# Patient Record
Sex: Female | Born: 1980 | Hispanic: Yes | Marital: Single | Smoking: Never smoker
Health system: Southern US, Community
[De-identification: ages and names within clinical notes are randomized; demographics above are authoritative.]

## PROBLEM LIST (undated history)

## (undated) DIAGNOSIS — E669 Obesity, unspecified: Secondary | ICD-10-CM

## (undated) DIAGNOSIS — E66811 Obesity, class 1: Secondary | ICD-10-CM

## (undated) HISTORY — DX: Obesity, unspecified: E66.9

## (undated) HISTORY — DX: Obesity, class 1: E66.811

## (undated) HISTORY — PX: CHOLECYSTECTOMY: SHX55

---

## 1999-08-05 ENCOUNTER — Ambulatory Visit (HOSPITAL_COMMUNITY): Admission: RE | Admit: 1999-08-05 | Discharge: 1999-08-05 | Payer: Self-pay | Admitting: Obstetrics and Gynecology

## 1999-11-08 ENCOUNTER — Inpatient Hospital Stay (HOSPITAL_COMMUNITY): Admission: AD | Admit: 1999-11-08 | Discharge: 1999-11-08 | Payer: Self-pay | Admitting: Obstetrics

## 1999-11-09 ENCOUNTER — Inpatient Hospital Stay (HOSPITAL_COMMUNITY): Admission: AD | Admit: 1999-11-09 | Discharge: 1999-11-11 | Payer: Self-pay | Admitting: Obstetrics & Gynecology

## 1999-11-09 ENCOUNTER — Encounter (INDEPENDENT_AMBULATORY_CARE_PROVIDER_SITE_OTHER): Payer: Self-pay | Admitting: Specialist

## 1999-12-23 ENCOUNTER — Encounter: Payer: Self-pay | Admitting: Emergency Medicine

## 1999-12-23 ENCOUNTER — Emergency Department (HOSPITAL_COMMUNITY): Admission: EM | Admit: 1999-12-23 | Discharge: 1999-12-23 | Payer: Self-pay | Admitting: Emergency Medicine

## 2000-01-21 ENCOUNTER — Ambulatory Visit (HOSPITAL_COMMUNITY): Admission: RE | Admit: 2000-01-21 | Discharge: 2000-01-22 | Payer: Self-pay | Admitting: General Surgery

## 2000-01-21 ENCOUNTER — Encounter (INDEPENDENT_AMBULATORY_CARE_PROVIDER_SITE_OTHER): Payer: Self-pay | Admitting: Specialist

## 2002-04-26 ENCOUNTER — Ambulatory Visit (HOSPITAL_COMMUNITY): Admission: RE | Admit: 2002-04-26 | Discharge: 2002-04-26 | Payer: Self-pay | Admitting: Obstetrics and Gynecology

## 2002-08-01 ENCOUNTER — Encounter: Payer: Self-pay | Admitting: *Deleted

## 2002-08-01 ENCOUNTER — Inpatient Hospital Stay (HOSPITAL_COMMUNITY): Admission: AD | Admit: 2002-08-01 | Discharge: 2002-08-01 | Payer: Self-pay | Admitting: *Deleted

## 2002-08-02 ENCOUNTER — Encounter (INDEPENDENT_AMBULATORY_CARE_PROVIDER_SITE_OTHER): Payer: Self-pay | Admitting: *Deleted

## 2002-08-02 ENCOUNTER — Inpatient Hospital Stay (HOSPITAL_COMMUNITY): Admission: AD | Admit: 2002-08-02 | Discharge: 2002-08-05 | Payer: Self-pay | Admitting: *Deleted

## 2002-08-02 ENCOUNTER — Inpatient Hospital Stay (HOSPITAL_COMMUNITY): Admission: AD | Admit: 2002-08-02 | Discharge: 2002-08-02 | Payer: Self-pay | Admitting: *Deleted

## 2002-08-02 DIAGNOSIS — O321XX0 Maternal care for breech presentation, not applicable or unspecified: Secondary | ICD-10-CM

## 2006-06-28 ENCOUNTER — Inpatient Hospital Stay (HOSPITAL_COMMUNITY): Admission: AD | Admit: 2006-06-28 | Discharge: 2006-06-28 | Payer: Self-pay | Admitting: Obstetrics and Gynecology

## 2006-09-17 ENCOUNTER — Inpatient Hospital Stay (HOSPITAL_COMMUNITY): Admission: AD | Admit: 2006-09-17 | Discharge: 2006-09-21 | Payer: Self-pay | Admitting: Obstetrics

## 2006-09-18 DIAGNOSIS — IMO0002 Reserved for concepts with insufficient information to code with codable children: Secondary | ICD-10-CM

## 2010-04-28 ENCOUNTER — Other Ambulatory Visit: Payer: Self-pay | Admitting: Family Medicine

## 2010-04-28 DIAGNOSIS — N6310 Unspecified lump in the right breast, unspecified quadrant: Secondary | ICD-10-CM

## 2010-05-01 ENCOUNTER — Ambulatory Visit
Admission: RE | Admit: 2010-05-01 | Discharge: 2010-05-01 | Disposition: A | Discharge status: Home / Self Care | Payer: Self-pay | Source: Ambulatory Visit | Attending: Family Medicine | Admitting: Family Medicine

## 2010-05-01 DIAGNOSIS — N6310 Unspecified lump in the right breast, unspecified quadrant: Secondary | ICD-10-CM

## 2010-06-24 NOTE — Op Note (Signed)
NAMELAQUITA, HARLAN NO.:  [id number]   MEDICAL RECORD NO.:  [id number]          PATIENT TYPE:  INP   LOCATION:  9164                          FACILITY:  [facility]   PHYSICIAN:  Kathreen Cosier, M.D.DATE OF BIRTH:  1981-01-04   DATE OF PROCEDURE:  09/18/2006  DATE OF DISCHARGE:                               OPERATIVE REPORT   PREOPERATIVE DIAGNOSIS:  Failure to progress in labor.   SURGEON:  Dr. Gaynell Face   ANESTHESIA:  Epidural.   PROCEDURE:  The patient placed on the operating table in supine  position.  Abdomen prepped and draped. Bladder emptied with Foley  catheter and a transverse suprapubic incision made through the old scar,  carried down to the rectus fascia. Fascia cleaned and incised length of  incision.  Recti muscles retracted laterally.  Peritoneum incised  longitudinally.  Transverse incision made in the visceral peritoneum  above the bladder.  Bladder mobilized inferiorly.  Transverse lower  uterine incision made.  The patient delivered from OP position of a female  Apgar 08/09, weighing 8 pounds 7 ounces from the OP position.  The team  in attendance.  The fluid clear.  Placenta anterior removed manually and  sent to pathology.  Uterine cavity cleaned with dry laps.  Uterine  incision closed in one layer with continuous suture of #1 chromic.  Hemostasis satisfactory.  Bladder flap reattached 2-0 chromic.  Uterus  well contracted.  Tubes and ovaries normal. Abdomen closed in layers,  peritoneum continuous suture of 0 chromic, fascia continuous suture of 0  Dexon, skin closed with subcuticular stitch of 4-0 Monocryl.  Blood loss  600 mL.           ______________________________  Kathreen Cosier, M.D.     BAM/MEDQ  D:  09/18/2006  T:  09/19/2006  Job:  [id number]

## 2010-06-24 NOTE — H&P (Signed)
NAMEVALMA, ROTENBERG NO.:  [id number]   MEDICAL RECORD NO.:  [id number]          PATIENT TYPE:  INP   LOCATION:  9164                          [facility]  [facility]   PHYSICIAN:  Kathreen Cosier, M.D.DATE OF BIRTH:  10/24/1980   DATE OF ADMISSION:  09/17/2006  DATE OF DISCHARGE:                              HISTORY & PHYSICAL   The patient is a 30 year old gravida 3, para 2-0-0-2, had one C-section  for breech.  Her estimated date of confinement September 16, 2006.  She had  a negative group B strep and she wanted VBAC.  The cervix was 1 to 2 cm,  80%, vertex -2.  Membranes ruptured artificially, fluid clear.  IUPC was  inserted and she was started on low dose Pitocin.   The patient received Pitocin throughout the day and was fully dilated at  8:51 p.m.  She pushed for 1-1/2 hours to +1 station, then there was no  further descent.  The vertex was molded and it was decided, after  pushing for 2 hours she would be delivered by C-section for failure to  progress in labor.   PHYSICAL EXAMINATION:  GENERAL:  This is a well-developed female in  labor.  HEENT:  Negative.  LUNGS:  Clear.  HEART:  Regular rhythm, no murmurs, no gallops.  BREASTS:  No masses.  ABDOMEN:  Term with estimated fetal weight greater than 8 pounds.  EXTREMITIES:  Negative.           ______________________________  Kathreen Cosier, M.D.     BAM/MEDQ  D:  09/18/2006  T:  09/18/2006  Job:  [id number]

## 2010-06-27 NOTE — Discharge Summary (Signed)
   Shelby Camacho, Shelby Camacho                          ACCOUNT NO.:  [id number]   MEDICAL RECORD NO.:  [id number]                   PATIENT TYPE:  INP   LOCATION:  9142                                 [facility]  [facility]   PHYSICIAN:  Dr. Kristen Loader                    DATE OF BIRTH:  [birth year]   DATE OF ADMISSION:  08/02/2002  DATE OF DISCHARGE:  08/05/2002                                 DISCHARGE SUMMARY   DISCHARGE DIAGNOSES:  1. Postoperative day #3 status post low transverse Cesarean section at 41     and 2 weeks for non-reassuring fetal heart tones.  2. Anemia.   DISCHARGE MEDICATIONS:  Include Motrin, Percocet, prenatal vitamins, iron  sulfate, and Depo-Provera.   HOSPITAL COURSE:  A 30 year old now G2, P2-0-0-2 postoperative day #3 status  post LTCS at 41-2/[redacted] week gestation for non-reassuring fetal heart tone  secondary to transverse arrest. The patient presented in active labor, given  an epidural. She had spontaneous rupture of membranes with clear fluid. She  experienced fetal tachycardia after the epidural and experienced maternal  hypotension. Was subsequently given 3 doses of Ephedrine. Mom was placed on  her left side, given supplemental oxygen and an IUPC and fetal scalp  electrodes were placed. She was started on Pitocin and received an  amnioinfusion for variable decelerations. After a trial of pushing with an  anterior lip, it was discovered that the baby was experiencing transverse  arrest and taken for emergent Cesarean section. She was found to have an  occult prolapsed umbilical cord. She delivered a term female infant with  Apgars of 8 at 1 minute and 9 at 5 minutes. The placenta was manually  removed. She had a total blood loss of approximately 600 cc. Tolerated the  procedure well and did well postoperatively. She plans to breast feed. She  is receiving Depo-Provera for birth control. She does not desire  circumcision.  In reviewing her labs, she is Rubella immune,  A negative,  antibody negative and GBS negative.   DISCHARGE LABORATORY DATA:  Include a hematocrit of 29.2, hemoglobin 9.9.   FOLLOW UP:  She is to followup at 6 weeks at [facility].     Lorne Skeens, D.O.                         Dr. Kristen Loader    KL/MEDQ  D:  08/25/2002  T:  08/27/2002  Job:  [id number]

## 2010-06-27 NOTE — Op Note (Signed)
[facility]  Patient:    Shelby Camacho, Shelby Camacho                       MRN: [id number] Proc. Date: 01/21/00 Adm. Date:  [id number] Attending:  Chevis Pretty S                           Operative Report  PREOPERATIVE DIAGNOSIS:  Symptomatic cholelithiasis.  POSTOPERATIVE DIAGNOSIS:  Symptomatic cholelithiasis.  OPERATION:  Laparoscopic cholecystectomy.  SURGEON:  Chevis Pretty, M.D.  ASSISTANT:  Angelia Mould. Derrell Lolling, M.D.  ANESTHESIA:  General endotracheal  DESCRIPTION OF PROCEDURE:  After informed consent was obtained, the patient was brought to the operating room and placed in the supine position on the operating room table.  After adequate induction of general anesthesia, the patients abdomen was prepped with Betadine and draped in the usual sterile manner.  A small infraumbilical vertically oriented incision was made with the 15 blade knife.  This incision was carried down through the subcutaneous tissue using blunt dissection with a Maryann Conners until the fascia of the anterior abdominal wall was encountered.  This fascial layer was incised with 15 blade knife and each side was grasped with Kocher clamps and elevated.  The preperitoneal space was then probed with the Kelly clamp bluntly until the peritoneum was opened and access was gained to the abdominal cavity.  Next, a 0 Vicryl pursestring stitch was placed in the fascia around this hole and a Hasson cannula was placed through this into the abdomen.  The Hasson was anchored with the previously placed pursestring stitch.  The abdomen was insufflated with carbon dioxide.  A 0 degree laparoscope was placed through the Hasson cannula and the edge of the liver and gallbladder were readily visible.  The patient was placed in the head up position and rotated slightly with the right side up.  Next, a small transverse upper midline incision was made with the 15 blade knife and a 10  mm trocar was placed through this incision into the abdominal cavity bluntly under direct vision.  Two smaller incisions were placed on the right side of the abdomen laterally with the 15 blade knife and 5 mm ports were placed through these incisions in the abdominal cavity under direct vision.  A blunt grasper was placed through the lateral most 5 mm trocar and used to grasp the dome of the gallbladder and to elevate it anteriorly and superiorly.  Another blunt grasper was placed at the other lateral port and used to grasp the infundibulum and neck of the gallbladder and retract it laterally.  A [place] dissector was placed through the upper midline port and using a combination of Bovie electrocautery and blunt dissection the gallbladder neck cystic duct junction was identified.  This area was dissected bluntly with the [place] dissector until the gallbladder neck cystic duct junction was visualized circumferentially.  Care was taken to keep the common duct medial to this dissection.  Once the cystic duct was identified and completely visualized as well as the area of the cystic duct gallbladder neck junction, three clips were placed proximally on the cystic duct and one distally and the cystic duct was divided between the two with laparoscopic scissors.  The cystic artery was identified posterior to this structure and was also dissected in a circumferential manner.  Two clips were placed proximally and one distally in the artery and the  artery was divided between the two with laparoscopic scissors.  Once this was complete, the gallbladder was then separated from the liver bed using a combination of blunt dissection and sharp dissection with both the hook and spatula electrocautery.  A small hole was made in the wall of the gallbladder in separating it from the liver bed.  When the gallbladder was nearly completely completely separated form the liver bed, the liver bed was then  re-examined and several small bleeding points were coagulated with the Bovie electrocautery and spatula.  The liver bed was then re-examined and was found to be hemostatic.  The gallbladder was then detached from the liver bed using the spatula electrocautery.  The laparoscope was then moved to the upper midline port and an endoscopic bag was placed through the Hasson cannula.  The endoscopic bag was then placed underneath the gallbladder and opened and the gallbladder was placed within the bag.  The endoscopic bag was then closed and removed from the abdomen through the umbilical port under direct vision.  The fascia through the infraumbilical hole was then closed with the previously placed pursestring Vicryl.  The liver bed was then re-examined and was found to be hemostatic.  The abdomen was then irrigated with copious amounts of saline to remove all the bile DD:  01/21/00 TD:  01/21/00 Job: [id number]

## 2010-06-27 NOTE — Discharge Summary (Signed)
NAMESCHYLER, BUTIKOFER NO.:  [id number]   MEDICAL RECORD NO.:  [id number]          PATIENT TYPE:  INP   LOCATION:  9117                          [facility]   PHYSICIAN:  Kathreen Cosier, M.D.DATE OF BIRTH:  03-21-1980   DATE OF ADMISSION:  09/17/2006  DATE OF DISCHARGE:  09/21/2006                               DISCHARGE SUMMARY   The patient is a 30 year old, gravida 3, para 2-0-2-1 previous C-section  for breech.  Her EDC was September 16, 2006 and negative GBS, and she wanted  to deliver vaginally.  She was 1-2 cm, 80% vertex, -2 on admission.  Estimated fetal weight was between 8-9 pounds.  The patient labored and  became fully dilated and pushed for 90 minutes with a vertex molded to  +1 station, and there was no descent of the fetal head.  She underwent a  repeat low transverse cesarean section and had a female, Apgar 8/9,  weighing 8 pounds 7 ounces from the OP position.  Postoperatively, she  did well.  On admission, her hemoglobin was 11.9, postoperative 9.2;  platelets 239, 194; white count 11.5, 13.6.  She was discharged on the  third postoperative day on ferrous sulfate and Tylox to see me in 6  weeks.   DISCHARGE DIAGNOSIS:  Status post repeat low transverse cesarean section  at term for failure to progress.           ______________________________  Kathreen Cosier, M.D.     BAM/MEDQ  D:  10/20/2006  T:  10/20/2006  Job:  [id number]

## 2010-06-27 NOTE — Op Note (Signed)
NAME:  Shelby Camacho, Shelby Camacho                          ACCOUNT NO.:  [id number]   MEDICAL RECORD NO.:  [id number]                   PATIENT TYPE:  INP   LOCATION:  9142                                 [facility]   PHYSICIAN:  Phil D. Okey Dupre, M.D.                  DATE OF BIRTH:  [birth year]   DATE OF PROCEDURE:  08/02/2002  DATE OF DISCHARGE:                                 OPERATIVE REPORT   PROCEDURE:  Low transverse cesarean section.   PREOPERATIVE DIAGNOSES:  1. Transverse arrest.  2. Nonreassuring fetal heart pattern.   POSTOPERATIVE DIAGNOSES:  1. Transverse arrest.  2. Nonreassuring fetal heart pattern.  3. Occult prolapsed cord.   OPERATIVE FINDINGS:  Immediately upon entering the uterine cavity, there was  a long loop of cord lying right under the opening against the baby's head  and on exiting the baby, the cord was around the shoulder as well and around  the arm.   PROCEDURE:  Under satisfactory epidural anesthesia with the patient in the  dorsal supine position, a Foley catheter in the urinary bladder, the abdomen  was prepped and draped in the usual sterile manner and entered through a  transverse Pfannenstiel incision situated 3 cm above the symphysis pubis and  extending for a total length of 18 cm.  The abdomen was entered by layers.  On entering the peritoneal cavity, the visceral peritoneum and the anterior  surface of the uterus opened transversely with sharp dissection.  The  bladder was pushed away from the lower uterine segment.  It was entered by  sharp and blunt dissection.  The findings were as above.  From an LOT  presentation the baby was easily delivered, the cord untangled, the cord  then doubly clamped, divided, and the baby handed to the pediatrician.  Had  an Apgar of 8 and 9.  I do not have a weight or a cord pH or the sex as yet.  The placenta was manually removed.  The uterus was explored and closed with  a continuous running locked 0 Vicryl  suture on an atraumatic needle.  Two  figure-of-eight sutures were then placed over the aforementioned suture line  where there was a small amount of oozing using the same suture and all cut  short.  The area was irrigated and observed again for bleeding, none was  noted, and the fascia was closed with continuous running alternating locked  0 Vicryl on an atraumatic needle.  Each layer was irrigated with normal  saline on exit.  Subcutaneous and skin edge bleeders were controlled with  hot cautery.  Skin edge approximation was carried out with skin staples.  Dry sterile dressing was applied.  Total blood loss during the procedure  approximately 600 mL.  The patient tolerated the procedure well and was  transferred to the recovery room in satisfactory condition with the Foley  catheter draining  clear amber urine.  At the end of the procedure, tape,  instrument, sponge, and needle count were reported as correct for two  counts.                                               Phil D. Okey Dupre, M.D.    PDR/MEDQ  D:  08/02/2002  T:  08/03/2002  Job:  [id number]

## 2010-11-24 LAB — CBC
HCT: 26.5 — ABNORMAL LOW
MCHC: 34.3
MCV: 89.2
Platelets: 194
RDW: 13.9
RDW: 14.2 — ABNORMAL HIGH
WBC: 13.6 — ABNORMAL HIGH

## 2010-11-24 LAB — RH IMMUNE GLOB WKUP(>/=20WKS)(NOT WOMEN'S HOSP)

## 2010-11-24 LAB — RPR: RPR Ser Ql: NONREACTIVE

## 2012-06-21 ENCOUNTER — Other Ambulatory Visit (HOSPITAL_COMMUNITY): Payer: Self-pay | Admitting: Nurse Practitioner

## 2012-06-21 DIAGNOSIS — R1031 Right lower quadrant pain: Secondary | ICD-10-CM

## 2012-06-23 ENCOUNTER — Ambulatory Visit (HOSPITAL_COMMUNITY)
Admission: RE | Admit: 2012-06-23 | Discharge: 2012-06-23 | Disposition: A | Discharge status: Home / Self Care | Payer: Self-pay | Source: Ambulatory Visit | Attending: Nurse Practitioner | Admitting: Nurse Practitioner

## 2012-06-23 DIAGNOSIS — Z9089 Acquired absence of other organs: Secondary | ICD-10-CM | POA: Insufficient documentation

## 2012-06-23 DIAGNOSIS — R1032 Left lower quadrant pain: Secondary | ICD-10-CM

## 2012-06-23 DIAGNOSIS — R109 Unspecified abdominal pain: Secondary | ICD-10-CM | POA: Insufficient documentation

## 2012-08-04 ENCOUNTER — Encounter: Payer: Self-pay | Admitting: Obstetrics & Gynecology

## 2013-07-17 ENCOUNTER — Other Ambulatory Visit (HOSPITAL_COMMUNITY): Payer: Self-pay | Admitting: Physician Assistant

## 2013-07-17 DIAGNOSIS — Z3689 Encounter for other specified antenatal screening: Secondary | ICD-10-CM

## 2013-07-17 LAB — OB RESULTS CONSOLE RUBELLA ANTIBODY, IGM: Rubella: IMMUNE

## 2013-07-17 LAB — OB RESULTS CONSOLE ABO/RH: RH Type: NEGATIVE

## 2013-07-17 LAB — OB RESULTS CONSOLE ANTIBODY SCREEN: ANTIBODY SCREEN: NEGATIVE

## 2013-07-17 LAB — OB RESULTS CONSOLE HIV ANTIBODY (ROUTINE TESTING): HIV: NONREACTIVE

## 2013-07-17 LAB — OB RESULTS CONSOLE VARICELLA ZOSTER ANTIBODY, IGG: Varicella: NON-IMMUNE/NOT IMMUNE

## 2013-07-17 LAB — OB RESULTS CONSOLE GBS: STREP GROUP B AG: POSITIVE

## 2013-07-17 LAB — OB RESULTS CONSOLE HEPATITIS B SURFACE ANTIGEN: Hepatitis B Surface Ag: NEGATIVE

## 2013-07-17 LAB — OB RESULTS CONSOLE RPR: RPR: NONREACTIVE

## 2013-08-15 LAB — OB RESULTS CONSOLE GC/CHLAMYDIA
CHLAMYDIA, DNA PROBE: NEGATIVE
GC PROBE AMP, GENITAL: NEGATIVE

## 2013-08-28 ENCOUNTER — Ambulatory Visit (HOSPITAL_COMMUNITY)
Admission: RE | Admit: 2013-08-28 | Discharge: 2013-08-28 | Disposition: A | Discharge status: Home / Self Care | Payer: Medicaid Other | Source: Ambulatory Visit | Attending: Physician Assistant | Admitting: Physician Assistant

## 2013-08-28 DIAGNOSIS — Z3689 Encounter for other specified antenatal screening: Secondary | ICD-10-CM

## 2013-09-12 ENCOUNTER — Other Ambulatory Visit (HOSPITAL_COMMUNITY): Payer: Self-pay | Admitting: Nurse Practitioner

## 2013-09-12 DIAGNOSIS — Z3689 Encounter for other specified antenatal screening: Secondary | ICD-10-CM

## 2013-10-03 ENCOUNTER — Ambulatory Visit (HOSPITAL_COMMUNITY)
Admission: RE | Admit: 2013-10-03 | Discharge: 2013-10-03 | Disposition: A | Discharge status: Home / Self Care | Payer: Medicaid Other | Source: Ambulatory Visit | Attending: Nurse Practitioner | Admitting: Nurse Practitioner

## 2013-10-03 ENCOUNTER — Encounter (HOSPITAL_COMMUNITY): Payer: Self-pay

## 2013-10-03 DIAGNOSIS — O358XX0 Maternal care for other (suspected) fetal abnormality and damage, not applicable or unspecified: Secondary | ICD-10-CM

## 2013-10-03 DIAGNOSIS — Z3689 Encounter for other specified antenatal screening: Secondary | ICD-10-CM | POA: Insufficient documentation

## 2013-12-11 ENCOUNTER — Encounter (HOSPITAL_COMMUNITY): Payer: Self-pay

## 2014-01-09 ENCOUNTER — Encounter (HOSPITAL_COMMUNITY): Payer: Self-pay

## 2014-01-09 NOTE — Pre-Procedure Instructions (Signed)
Rhogram given in MD office on 10/30/13 - Lot # [id number]

## 2014-01-09 NOTE — Patient Instructions (Addendum)
   Your procedure is scheduled on:  Thursday, Dec 3  [facility] at: 12:30 PM Pick up the phone at the desk and dial [phone] and inform us of your arrival.  Please call this number if you have any problems the morning of surgery: [phone]  Remember: Do not eat food after midnight: Wednesday Do not drink clear liquids after: 10 AM Thursday, day of surgery Take these medicines the morning of surgery with a SIP OF WATER:  None  Do not wear jewelry, make-up, or FINGER nail polish No metal in your hair or on your body. Do not wear lotions, powders, perfumes.  You may wear deodorant.  Do not bring valuables to the hospital. Contacts, dentures or bridgework may not be worn into surgery.  Leave suitcase in the car. After Surgery it may be brought to your room. For patients being admitted to the hospital, checkout time is 11:00am the day of discharge.  Home with friend Judeth CornfieldStephanie cell [phone]

## 2014-01-10 ENCOUNTER — Encounter (HOSPITAL_COMMUNITY)
Admission: RE | Admit: 2014-01-10 | Discharge: 2014-01-10 | Disposition: A | Discharge status: Home / Self Care | Payer: Medicaid Other | Source: Ambulatory Visit | Attending: Obstetrics & Gynecology | Admitting: Obstetrics & Gynecology

## 2014-01-10 ENCOUNTER — Encounter (HOSPITAL_COMMUNITY): Payer: Self-pay

## 2014-01-10 DIAGNOSIS — Z01812 Encounter for preprocedural laboratory examination: Secondary | ICD-10-CM | POA: Insufficient documentation

## 2014-01-10 LAB — TYPE AND SCREEN
ABO/RH(D): A NEG
ANTIBODY SCREEN: NEGATIVE

## 2014-01-10 LAB — CBC
HEMATOCRIT: 34.2 % — AB (ref 36.0–46.0)
Hemoglobin: 11.7 g/dL — ABNORMAL LOW (ref 12.0–15.0)
MCH: 31.1 pg (ref 26.0–34.0)
MCHC: 34.2 g/dL (ref 30.0–36.0)
MCV: 91 fL (ref 78.0–100.0)
Platelets: 234 10*3/uL (ref 150–400)
RBC: 3.76 MIL/uL — AB (ref 3.87–5.11)
RDW: 13.3 % (ref 11.5–15.5)
WBC: 6.8 10*3/uL (ref 4.0–10.5)

## 2014-01-10 LAB — RPR

## 2014-01-10 NOTE — Pre-Procedure Instructions (Signed)
Used International PaperEda Royal, Research officer, trade unionpanish Interpreter for translation during PAT appointment.  Spanish speaking patient.

## 2014-01-11 ENCOUNTER — Encounter (HOSPITAL_COMMUNITY)
Admission: RE | Disposition: A | Discharge status: Home / Self Care | Payer: Self-pay | Source: Ambulatory Visit | Attending: Obstetrics & Gynecology

## 2014-01-11 ENCOUNTER — Encounter (HOSPITAL_COMMUNITY): Payer: Self-pay | Admitting: Emergency Medicine

## 2014-01-11 ENCOUNTER — Inpatient Hospital Stay (HOSPITAL_COMMUNITY): Payer: Medicaid Other | Admitting: Anesthesiology

## 2014-01-11 ENCOUNTER — Inpatient Hospital Stay (HOSPITAL_COMMUNITY)
Admission: RE | Admit: 2014-01-11 | Discharge: 2014-01-14 | DRG: 766 | Disposition: A | Discharge status: Home / Self Care | Payer: Medicaid Other | Source: Ambulatory Visit | Attending: Family Medicine | Admitting: Family Medicine

## 2014-01-11 DIAGNOSIS — IMO0001 Reserved for inherently not codable concepts without codable children: Secondary | ICD-10-CM

## 2014-01-11 DIAGNOSIS — O99824 Streptococcus B carrier state complicating childbirth: Secondary | ICD-10-CM | POA: Diagnosis present

## 2014-01-11 DIAGNOSIS — O3421 Maternal care for scar from previous cesarean delivery: Secondary | ICD-10-CM | POA: Diagnosis present

## 2014-01-11 DIAGNOSIS — Z3A39 39 weeks gestation of pregnancy: Secondary | ICD-10-CM | POA: Diagnosis present

## 2014-01-11 SURGERY — Surgical Case
Anesthesia: Spinal | Site: Abdomen

## 2014-01-11 MED ORDER — ONDANSETRON HCL 4 MG/2ML IJ SOLN: 4.0000 mg | INTRAMUSCULAR | Status: DC | PRN

## 2014-01-11 MED ORDER — SENNOSIDES-DOCUSATE SODIUM 8.6-50 MG PO TABS
2.0000 | ORAL_TABLET | ORAL | Status: DC
  Administered 2014-01-12: 2 via ORAL
  Filled 2014-01-11 (×2): qty 2

## 2014-01-11 MED ORDER — SIMETHICONE 80 MG PO CHEW
80.0000 mg | CHEWABLE_TABLET | ORAL | Status: DC
  Administered 2014-01-12 (×2): 80 mg via ORAL
  Filled 2014-01-11 (×3): qty 1

## 2014-01-11 MED ORDER — MENTHOL 3 MG MT LOZG: 1.0000 | LOZENGE | OROMUCOSAL | Status: DC | PRN

## 2014-01-11 MED ORDER — MORPHINE SULFATE (PF) 0.5 MG/ML IJ SOLN
INTRAMUSCULAR | Status: DC | PRN
  Administered 2014-01-11: .1 mg via INTRATHECAL

## 2014-01-11 MED ORDER — KETOROLAC TROMETHAMINE 30 MG/ML IJ SOLN
INTRAMUSCULAR | Status: AC
  Administered 2014-01-11: 30 mg via INTRAVENOUS
  Filled 2014-01-11: qty 1

## 2014-01-11 MED ORDER — SIMETHICONE 80 MG PO CHEW
80.0000 mg | CHEWABLE_TABLET | Freq: Three times a day (TID) | ORAL | Status: DC
  Administered 2014-01-12 – 2014-01-14 (×7): 80 mg via ORAL
  Filled 2014-01-11 (×6): qty 1

## 2014-01-11 MED ORDER — PHENYLEPHRINE 8 MG IN D5W 100 ML (0.08MG/ML) PREMIX OPTIME
INJECTION | INTRAVENOUS | Status: DC | PRN
  Administered 2014-01-11: 60 ug/min via INTRAVENOUS

## 2014-01-11 MED ORDER — SCOPOLAMINE 1 MG/3DAYS TD PT72: 1.0000 | MEDICATED_PATCH | Freq: Once | TRANSDERMAL | Status: DC

## 2014-01-11 MED ORDER — KETOROLAC TROMETHAMINE 30 MG/ML IJ SOLN: 30.0000 mg | Freq: Four times a day (QID) | INTRAMUSCULAR | Status: DC | PRN

## 2014-01-11 MED ORDER — IBUPROFEN 600 MG PO TABS
600.0000 mg | ORAL_TABLET | Freq: Four times a day (QID) | ORAL | Status: DC
  Administered 2014-01-12 – 2014-01-14 (×10): 600 mg via ORAL
  Filled 2014-01-11 (×10): qty 1

## 2014-01-11 MED ORDER — LACTATED RINGERS IV SOLN: INTRAVENOUS | Status: DC

## 2014-01-11 MED ORDER — SODIUM CHLORIDE 0.9 % IJ SOLN: 3.0000 mL | INTRAMUSCULAR | Status: DC | PRN

## 2014-01-11 MED ORDER — FENTANYL CITRATE 0.05 MG/ML IJ SOLN
25.0000 ug | INTRAMUSCULAR | Status: DC | PRN
Start: 2014-01-11 — End: 2014-01-11

## 2014-01-11 MED ORDER — CEFAZOLIN SODIUM-DEXTROSE 2-3 GM-% IV SOLR: 2.0000 g | INTRAVENOUS | Status: DC

## 2014-01-11 MED ORDER — FENTANYL CITRATE 0.05 MG/ML IJ SOLN
INTRAMUSCULAR | Status: DC | PRN
  Administered 2014-01-11: 25 ug via INTRATHECAL

## 2014-01-11 MED ORDER — ONDANSETRON HCL 4 MG PO TABS: 4.0000 mg | ORAL_TABLET | ORAL | Status: DC | PRN

## 2014-01-11 MED ORDER — NALBUPHINE HCL 10 MG/ML IJ SOLN: 5.0000 mg | Freq: Once | INTRAMUSCULAR | Status: DC | PRN

## 2014-01-11 MED ORDER — GLYCOPYRROLATE 0.2 MG/ML IJ SOLN
INTRAMUSCULAR | Status: DC | PRN
  Administered 2014-01-11: .2 mg via INTRAVENOUS

## 2014-01-11 MED ORDER — MEPERIDINE HCL 25 MG/ML IJ SOLN: 6.2500 mg | INTRAMUSCULAR | Status: DC | PRN

## 2014-01-11 MED ORDER — DIBUCAINE 1 % RE OINT
1.0000 | TOPICAL_OINTMENT | RECTAL | Status: DC | PRN
Start: 2014-01-11 — End: 2014-01-14

## 2014-01-11 MED ORDER — DIPHENHYDRAMINE HCL 25 MG PO CAPS
25.0000 mg | ORAL_CAPSULE | ORAL | Status: DC | PRN
Start: 2014-01-11 — End: 2014-01-11

## 2014-01-11 MED ORDER — CEFAZOLIN SODIUM-DEXTROSE 2-3 GM-% IV SOLR
INTRAVENOUS | Status: AC
  Filled 2014-01-11: qty 50

## 2014-01-11 MED ORDER — DEXAMETHASONE SODIUM PHOSPHATE 10 MG/ML IJ SOLN
INTRAMUSCULAR | Status: DC | PRN
  Administered 2014-01-11: 5 mg via INTRAVENOUS

## 2014-01-11 MED ORDER — TETANUS-DIPHTH-ACELL PERTUSSIS 5-2.5-18.5 LF-MCG/0.5 IM SUSP: 0.5000 mL | Freq: Once | INTRAMUSCULAR | Status: DC

## 2014-01-11 MED ORDER — OXYTOCIN 10 UNIT/ML IJ SOLN
40.0000 [IU] | INTRAVENOUS | Status: DC | PRN
  Administered 2014-01-11: 40 [IU] via INTRAVENOUS

## 2014-01-11 MED ORDER — SIMETHICONE 80 MG PO CHEW: 80.0000 mg | CHEWABLE_TABLET | ORAL | Status: DC | PRN

## 2014-01-11 MED ORDER — LACTATED RINGERS IV SOLN
INTRAVENOUS | Status: DC
  Administered 2014-01-12: 05:00:00 via INTRAVENOUS

## 2014-01-11 MED ORDER — KETOROLAC TROMETHAMINE 30 MG/ML IJ SOLN
30.0000 mg | Freq: Four times a day (QID) | INTRAMUSCULAR | Status: DC | PRN
Start: 2014-01-11 — End: 2014-01-11
  Administered 2014-01-11: 30 mg via INTRAVENOUS

## 2014-01-11 MED ORDER — SCOPOLAMINE 1 MG/3DAYS TD PT72
MEDICATED_PATCH | TRANSDERMAL | Status: AC
  Administered 2014-01-11: 1.5 mg via TRANSDERMAL
  Filled 2014-01-11: qty 1

## 2014-01-11 MED ORDER — ONDANSETRON HCL 4 MG/2ML IJ SOLN
INTRAMUSCULAR | Status: DC | PRN
  Administered 2014-01-11: 4 mg via INTRAVENOUS

## 2014-01-11 MED ORDER — NALBUPHINE HCL 10 MG/ML IJ SOLN: 5.0000 mg | INTRAMUSCULAR | Status: DC | PRN

## 2014-01-11 MED ORDER — OXYCODONE-ACETAMINOPHEN 5-325 MG PO TABS: 2.0000 | ORAL_TABLET | ORAL | Status: DC | PRN

## 2014-01-11 MED ORDER — ONDANSETRON HCL 4 MG/2ML IJ SOLN: 4.0000 mg | Freq: Three times a day (TID) | INTRAMUSCULAR | Status: DC | PRN

## 2014-01-11 MED ORDER — CEFAZOLIN SODIUM-DEXTROSE 2-3 GM-% IV SOLR
2.0000 g | INTRAVENOUS | Status: AC
  Administered 2014-01-11: 2 g via INTRAVENOUS
  Filled 2014-01-11: qty 50

## 2014-01-11 MED ORDER — LANOLIN HYDROUS EX OINT: 1.0000 "application " | TOPICAL_OINTMENT | CUTANEOUS | Status: DC | PRN

## 2014-01-11 MED ORDER — BUPIVACAINE HCL (PF) 0.5 % IJ SOLN
INTRAMUSCULAR | Status: DC | PRN
  Administered 2014-01-11: 30 mL

## 2014-01-11 MED ORDER — DIPHENHYDRAMINE HCL 25 MG PO CAPS
25.0000 mg | ORAL_CAPSULE | Freq: Four times a day (QID) | ORAL | Status: DC | PRN
Start: 2014-01-11 — End: 2014-01-14
  Administered 2014-01-12: 25 mg via ORAL
  Filled 2014-01-11: qty 1

## 2014-01-11 MED ORDER — PHENYLEPHRINE HCL 10 MG/ML IJ SOLN
INTRAMUSCULAR | Status: DC | PRN
  Administered 2014-01-11: 80 ug via INTRAVENOUS

## 2014-01-11 MED ORDER — BUPIVACAINE HCL (PF) 0.5 % IJ SOLN
INTRAMUSCULAR | Status: AC
  Filled 2014-01-11: qty 30

## 2014-01-11 MED ORDER — DIPHENHYDRAMINE HCL 50 MG/ML IJ SOLN: 12.5000 mg | INTRAMUSCULAR | Status: DC | PRN

## 2014-01-11 MED ORDER — OXYTOCIN 40 UNITS IN LACTATED RINGERS INFUSION - SIMPLE MED: 62.5000 mL/h | INTRAVENOUS | Status: AC

## 2014-01-11 MED ORDER — LACTATED RINGERS IV SOLN
Freq: Once | INTRAVENOUS | Status: AC
  Administered 2014-01-11: 13:00:00 via INTRAVENOUS

## 2014-01-11 MED ORDER — ZOLPIDEM TARTRATE 5 MG PO TABS: 5.0000 mg | ORAL_TABLET | Freq: Every evening | ORAL | Status: DC | PRN

## 2014-01-11 MED ORDER — 0.9 % SODIUM CHLORIDE (POUR BTL) OPTIME
TOPICAL | Status: DC | PRN
  Administered 2014-01-11: 1000 mL

## 2014-01-11 MED ORDER — WITCH HAZEL-GLYCERIN EX PADS: 1.0000 "application " | MEDICATED_PAD | CUTANEOUS | Status: DC | PRN

## 2014-01-11 MED ORDER — LACTATED RINGERS IV SOLN
INTRAVENOUS | Status: DC
  Administered 2014-01-11 (×3): via INTRAVENOUS

## 2014-01-11 MED ORDER — NALOXONE HCL 1 MG/ML IJ SOLN: 1.0000 ug/kg/h | INTRAVENOUS | Status: DC | PRN

## 2014-01-11 MED ORDER — PRENATAL MULTIVITAMIN CH
1.0000 | ORAL_TABLET | Freq: Every day | ORAL | Status: DC
  Administered 2014-01-12 – 2014-01-13 (×2): 1 via ORAL
  Filled 2014-01-11 (×2): qty 1

## 2014-01-11 MED ORDER — NALOXONE HCL 0.4 MG/ML IJ SOLN: 0.4000 mg | INTRAMUSCULAR | Status: DC | PRN

## 2014-01-11 MED ORDER — OXYCODONE-ACETAMINOPHEN 5-325 MG PO TABS
1.0000 | ORAL_TABLET | ORAL | Status: DC | PRN
  Administered 2014-01-12 – 2014-01-13 (×3): 1 via ORAL
  Filled 2014-01-11 (×3): qty 1

## 2014-01-11 SURGICAL SUPPLY — 27 items
BARRIER ADHS 3X4 INTERCEED (GAUZE/BANDAGES/DRESSINGS) IMPLANT
BENZOIN TINCTURE PRP APPL 2/3 (GAUZE/BANDAGES/DRESSINGS) ×3 IMPLANT
CLAMP CORD UMBIL (MISCELLANEOUS) ×3 IMPLANT
CLOSURE WOUND 1/2 X4 (GAUZE/BANDAGES/DRESSINGS) ×1
CLOTH BEACON ORANGE TIMEOUT ST (SAFETY) ×3 IMPLANT
DRAPE SHEET LG 3/4 BI-LAMINATE (DRAPES) ×3 IMPLANT
DRSG OPSITE POSTOP 4X10 (GAUZE/BANDAGES/DRESSINGS) ×3 IMPLANT
DURAPREP 26ML APPLICATOR (WOUND CARE) ×3 IMPLANT
ELECT REM PT RETURN 9FT ADLT (ELECTROSURGICAL) ×3
ELECTRODE REM PT RTRN 9FT ADLT (ELECTROSURGICAL) ×1 IMPLANT
EXTRACTOR VACUUM KIWI (MISCELLANEOUS) ×3 IMPLANT
GLOVE BIO SURGEON STRL SZ 6.5 (GLOVE) ×2 IMPLANT
GLOVE BIO SURGEONS STRL SZ 6.5 (GLOVE) ×1
GLOVE BIOGEL PI IND STRL 7.0 (GLOVE) ×1 IMPLANT
GLOVE BIOGEL PI INDICATOR 7.0 (GLOVE) ×2
GOWN STRL REUS W/TWL LRG LVL3 (GOWN DISPOSABLE) ×6 IMPLANT
NS IRRIG 1000ML POUR BTL (IV SOLUTION) ×3 IMPLANT
PACK C SECTION WH (CUSTOM PROCEDURE TRAY) ×3 IMPLANT
PAD OB MATERNITY 4.3X12.25 (PERSONAL CARE ITEMS) ×3 IMPLANT
STRIP CLOSURE SKIN 1/2X4 (GAUZE/BANDAGES/DRESSINGS) ×2 IMPLANT
SUT VIC AB 0 CT1 36 (SUTURE) ×18 IMPLANT
SUT VIC AB 2-0 CT1 27 (SUTURE) ×2
SUT VIC AB 2-0 CT1 TAPERPNT 27 (SUTURE) ×1 IMPLANT
SUT VIC AB 4-0 PS2 27 (SUTURE) ×3 IMPLANT
TOWEL OR 17X24 6PK STRL BLUE (TOWEL DISPOSABLE) ×3 IMPLANT
TRAY FOLEY CATH 14FR (SET/KITS/TRAYS/PACK) ×3 IMPLANT
WATER STERILE IRR 1000ML POUR (IV SOLUTION) ×3 IMPLANT

## 2014-01-11 NOTE — OR Nursing (Signed)
Will go to room when doctor writes orders

## 2014-01-11 NOTE — Anesthesia Postprocedure Evaluation (Signed)
  Anesthesia Post-op Note  Patient: Shelby Camacho  Procedure(s) Performed: Procedure(s): REPEAT CESAREAN SECTION (N/A)  Patient Location: PACU  Anesthesia Type:General  Level of Consciousness: awake, alert  and oriented  Airway and Oxygen Therapy: Patient Spontanous Breathing  Post-op Pain: none  Post-op Assessment: Post-op Vital signs reviewed, Patient's Cardiovascular Status Stable, Respiratory Function Stable, Patent Airway, No signs of Nausea or vomiting, Pain level controlled, No headache and No backache  Post-op Vital Signs: Reviewed and stable  Last Vitals:  Filed Vitals:   01/11/14 1615  BP: 110/95  Pulse: 48  Temp:   Resp: 19    Complications: No apparent anesthesia complications

## 2014-01-11 NOTE — Plan of Care (Signed)
Problem: Phase I Progression Outcomes Goal: Pain controlled with appropriate interventions Outcome: Completed/Met Date Met:  01/11/14 Goal: Foley catheter patent Outcome: Completed/Met Date Met:  01/11/14

## 2014-01-11 NOTE — Anesthesia Procedure Notes (Signed)
Spinal Patient location during procedure: OR Preanesthetic Checklist Completed: patient identified, site marked, surgical consent, pre-op evaluation, timeout performed, IV checked, risks and benefits discussed and monitors and equipment checked Spinal Block Patient position: sitting Prep: DuraPrep Patient monitoring: heart rate, cardiac monitor, continuous pulse ox and blood pressure Approach: midline Location: L3-4 Injection technique: single-shot Needle Needle type: Sprotte  Needle gauge: 24 G Needle length: 9 cm Assessment Sensory level: T4 Additional Notes Spinal Dosage in OR  Bupivicaine ml       1.2 PFMS04   mcg        100 Fentanyl mcg            25    

## 2014-01-11 NOTE — Anesthesia Preprocedure Evaluation (Signed)
Anesthesia Evaluation  Patient identified by MRN, date of birth, ID band Patient awake    Reviewed: Allergy & Precautions, H&P , NPO status , Patient's Chart, lab work & pertinent test results  Airway Mallampati: III  TM Distance: >3 FB Neck ROM: Limited    Dental no notable dental hx. (+) Teeth Intact   Pulmonary neg pulmonary ROS,    Pulmonary exam normal       Cardiovascular negative cardio ROS  Rhythm:Regular Rate:Normal     Neuro/Psych negative neurological ROS  negative psych ROS   GI/Hepatic negative GI ROS, Neg liver ROS,   Endo/Other    Renal/GU negative Renal ROS  negative genitourinary   Musculoskeletal negative musculoskeletal ROS (+)   Abdominal   Peds  Hematology negative hematology ROS (+)   Anesthesia Other Findings   Reproductive/Obstetrics (+) Pregnancy Previous C/Section x 2                             Anesthesia Physical Anesthesia Plan  ASA: II  Anesthesia Plan: Spinal   Post-op Pain Management:    Induction:   Airway Management Planned: Natural Airway  Additional Equipment:   Intra-op Plan:   Post-operative Plan:   Informed Consent: I have reviewed the patients History and Physical, chart, labs and discussed the procedure including the risks, benefits and alternatives for the proposed anesthesia with the patient or authorized representative who has indicated his/her understanding and acceptance.     Plan Discussed with: Anesthesiologist  Anesthesia Plan Comments:         Anesthesia Quick Evaluation

## 2014-01-11 NOTE — Op Note (Signed)
Shelby PackerBlanca Camacho PROCEDURE DATE: 01/11/2014  PREOPERATIVE DIAGNOSES: Intrauterine pregnancy at 7245w1d weeks gestation; repeat cesarean section  POSTOPERATIVE DIAGNOSES: The same  PROCEDURE: Repeat Low Transverse Cesarean Section  SURGEON:  Dr. Scheryl DarterJames Arnold  ASSISTANT:  Dr. Fredirick LatheKristy Jaquasia Doscher  ANESTHESIOLOGIST: Dr. Cristela BlueJackson Kyle  INDICATIONS: Shelby PackerBlanca Camacho is a 33 y.o. [phone] [id number] at 2045w1d here for cesarean section secondary to the indications listed under preoperative diagnoses including that patient had 2 prior cesarean sections; please see preoperative note for further details.  The risks of cesarean section were discussed with the patient including but were not limited to: bleeding which may require transfusion or reoperation; infection which may require antibiotics; injury to bowel, bladder, ureters or other surrounding organs; injury to the fetus; need for additional procedures including hysterectomy in the event of a life-threatening hemorrhage; placental abnormalities wth subsequent pregnancies, incisional problems, thromboembolic phenomenon and other postoperative/anesthesia complications.   The patient concurred with the proposed plan, giving informed written consent for the procedure.    FINDINGS:  Viable female infant in cephalic presentation.  Apgars 9 and 9.  Clear amniotic fluid.  Intact placenta, three vessel cord.  Normal uterus, fallopian tubes and ovaries bilaterally.  ANESTHESIA: Spinal INTRAVENOUS FLUIDS: 1000ml ESTIMATED BLOOD LOSS: 500 ml URINE OUTPUT:  250 ml SPECIMENS: L&D COMPLICATIONS: None immediate  PROCEDURE IN DETAIL:  The patient preoperatively received intravenous antibiotics and had sequential compression devices applied to her lower extremities.  She was then taken to the operating room where spinal anesthesia was administered and was found to be adequate. She was then placed in a dorsal supine position with a leftward tilt, and prepped and draped in a sterile manner.   A foley catheter was placed into her bladder and attached to constant gravity.  After an adequate timeout was performed, a Pfannenstiel skin incision was made with scalpel and carried through to the underlying layer of fascia. The fascia was incised in the midline, and this incision was extended bilaterally using the Mayo scissors with caution as the fascia was adhered to rectus muscles.  Kocher clamps were applied to the superior aspect of the fascial incision and the underlying rectus muscles were dissected off bluntly. A similar process was carried out on the inferior aspect of the fascial incision. The rectus muscles were separated in the midline bluntly and the peritoneum was entered bluntly. Attention was turned to the lower uterine segment where a low transverse hysterotomy was made with a scalpel and extended bilaterally bluntly.  The infant was successfully delivered, the cord was clamped and cut and the infant was handed over to awaiting neonatology team. Uterine massage was then administered, and the placenta delivered intact with a three-vessel cord. The uterus was then cleared of clot and debris.  The hysterotomy was closed with 0 Vicryl in a running locked fashion, and an imbricating layer was also placed with 0 Vicryl. The pelvis was cleared of all clot and debris. Hemostasis was confirmed on all surfaces.  The peritoneum and the muscles were reapproximated using 0 Vicryl interrupted stitches. The fascia was then closed using 0 Vicryl in a running fashion.  The subcutaneous layer was irrigated and 30 ml of 0.5% Marcaine was injected subcutaneously around the incision.  The skin was closed with a 4-0 Vicryl subcuticular stitch. The patient tolerated the procedure well. Sponge, lap, instrument and needle counts were correct x 2.  She was taken to the recovery room in stable condition.   Ngozi Alvidrez ROCIO

## 2014-01-11 NOTE — OR Nursing (Signed)
Dr Debroah LoopArnold updated on pt and orders will  Be written in approximately 1 hour.

## 2014-01-11 NOTE — H&P (Signed)
Shelby PackerBlanca Camacho is a 33 y.o. female [phone] at 7257w1d by early US presenting for repeat C-section.  Previous C-sections for breech presentation and failed TOLAC.     Maternal Medical History:  Reason for admission: Nausea.    OB History    Gravida Para Term Preterm AB TAB SAB Ectopic Multiple Living   4 2 2       3      Past Medical History  Diagnosis Date  . SVD (spontaneous vaginal delivery)     x 1   Past Surgical History  Procedure Laterality Date  . Cesarean section      x 2  . Cholecystectomy     Family History: family history is not on file. Social History:  reports that she has never smoked. She has never used smokeless tobacco. She reports that she does not drink alcohol or use illicit drugs.   Review of Systems  Constitutional: Negative for fever and chills.  Eyes: Negative for blurred vision.  Respiratory: Negative for cough and shortness of breath.   Cardiovascular: Negative for chest pain and leg swelling.  Gastrointestinal: Negative for nausea and vomiting.  Neurological: Negative for headaches.      There were no vitals taken for this visit. Exam Physical Exam  Constitutional: She is oriented to person, place, and time. She appears well-developed and well-nourished. No distress.  HENT:  Head: Normocephalic and atraumatic.  Cardiovascular: Normal rate.   Respiratory: Effort normal. No respiratory distress.  Neurological: She is alert and oriented to person, place, and time.  Skin: Skin is warm and dry.    Prenatal labs: ABO, Rh: --/--/A NEG (12/02 0940) Antibody: NEG (12/02 0940) Rubella: Immune (06/08 0000) RPR: NON REAC (12/02 0945)  HBsAg: Negative (06/08 0000)  HIV: Non-reactive (06/08 0000)  GBS: Positive (06/08 0000)   Assessment/Plan: Shelby PackerBlanca Camacho is a 33 y.o. G4P2003 at 5157w1d here for repeat Cesarean section.  - Scheduled for CS at 2pm.    Shelby LatchBacigalupo, Shelby 01/11/2014, 12:36 PM    OB fellow attestation:  I have seen and  examined this patient; I agree with above documentation in the resident's note.   Shelby PackerBlanca Camacho is a 33 y.o. [phone] here for repeat cesarean section after 1SVD, 1 section, 1 failed TOL (total 2 LTCS). Wants to breast feed, depo vs OCPs for birth control  PE: There were no vitals taken for this visit. Gen: calm comfortable, NAD Resp: normal effort, no distress Abd: gravid  ROS, labs, PMH reviewed  Plan: The risks of cesarean section discussed with the patient included but were not limited to: bleeding which may require transfusion or reoperation; infection which may require antibiotics; injury to bowel, bladder, ureters or other surrounding organs; injury to the fetus; need for additional procedures including hysterectomy in the event of a life-threatening hemorrhage; placental abnormalities wth subsequent pregnancies, incisional problems, thromboembolic phenomenon and other postoperative/anesthesia complications. The patient concurred with the proposed plan, giving informed written consent for the procedure.   Patient has been NPO since before midnight she will remain NPO for procedure. Anesthesia and OR aware. Preoperative prophylactic antibiotics and SCDs ordered on call to the OR.  To OR when ready.     Perry MountACOSTA,Lillard Bailon ROCIO, MD 1:15 PM

## 2014-01-11 NOTE — Transfer of Care (Signed)
Immediate Anesthesia Transfer of Care Note  Patient: Shelby PackerBlanca Galindo  Procedure(s) Performed: Procedure(s): REPEAT CESAREAN SECTION (N/A)  Patient Location: PACU  Anesthesia Type:Spinal  Level of Consciousness: awake, alert  and oriented  Airway & Oxygen Therapy: Patient Spontanous Breathing  Post-op Assessment: Report given to PACU RN and Post -op Vital signs reviewed and stable  Post vital signs: Reviewed and stable  Complications: No apparent anesthesia complications

## 2014-01-12 ENCOUNTER — Encounter (HOSPITAL_COMMUNITY): Payer: Self-pay | Admitting: Obstetrics & Gynecology

## 2014-01-12 NOTE — Progress Notes (Signed)
Subjective: Postpartum Day 1: Cesarean Delivery Patient reports no flatus or BM. Reports no pain.   Objective: Vital signs in last 24 hours: Temp:  [97.7 F (36.5 C)-98.2 F (36.8 C)] 98.2 F (36.8 C) (12/04 0630) Pulse Rate:  [46-73] 58 (12/04 0630) Resp:  [16-20] 18 (12/04 0630) BP: (88-128)/(46-95) 92/50 mmHg (12/04 0630) SpO2:  [95 %-100 %] 96 % (12/04 0630)  Physical Exam:  General: alert, cooperative and no distress Lochia: appropriate Uterine Fundus: firm Incision: healing well DVT Evaluation: No evidence of DVT seen on physical exam. Negative Homan's sign.   Recent Labs  01/10/14 0946  HGB 11.7*  HCT 34.2*    Assessment/Plan: Status post Cesarean section. Doing well postoperatively.  Continue current care. Will have patient walk around in order to increase bowel  Movement.  Will monitor for any bowel movements or flatus.   Laymond PurserFong, Oralee Rapaport K 01/12/2014, 8:02 AM

## 2014-01-12 NOTE — Addendum Note (Signed)
Addendum  created 01/12/14 1044 by Angela Adamana G Sallye Lunz, CRNA   Modules edited: Notes Section   Notes Section:  File: [id number]

## 2014-01-12 NOTE — Plan of Care (Signed)
Problem: Phase I Progression Outcomes Goal: Voiding adequately Outcome: Completed/Met Date Met:  01/12/14

## 2014-01-12 NOTE — Anesthesia Postprocedure Evaluation (Signed)
  Anesthesia Post-op Note  Patient: Shelby Camacho  Procedure(s) Performed: Procedure(s): REPEAT CESAREAN SECTION (N/A)  Patient Location: Mother/Baby  Anesthesia Type:Spinal  Level of Consciousness: awake and alert   Airway and Oxygen Therapy: Patient Spontanous Breathing  Post-op Pain: mild  Post-op Assessment: Post-op Vital signs reviewed, Patient's Cardiovascular Status Stable, Respiratory Function Stable, No signs of Nausea or vomiting, Pain level controlled, No headache, No residual numbness and No residual motor weakness  Post-op Vital Signs: Reviewed  Last Vitals:  Filed Vitals:   01/12/14 0630  BP: 92/50  Pulse: 58  Temp: 36.8 C  Resp: 18    Complications: No apparent anesthesia complications

## 2014-01-12 NOTE — Plan of Care (Signed)
Problem: Phase I Progression Outcomes Goal: OOB as tolerated unless otherwise ordered Outcome: Completed/Met Date Met:  01/12/14 Goal: IS, TCDB as ordered Outcome: Completed/Met Date Met:  01/12/14 Goal: VS, stable, temp < 100.4 degrees F Outcome: Completed/Met Date Met:  01/12/14 Goal: Initial discharge plan identified Outcome: Completed/Met Date Met:  01/12/14 Goal: Other Phase I Outcomes/Goals Outcome: Not Applicable Date Met:  70/26/37

## 2014-01-12 NOTE — Lactation Note (Signed)
This note was copied from the chart of Girl Reece PackerBlanca Galindo. Lactation Consultation Note Experienced BF mom BF her other children from 7 months to 1 yr. States this baby BF well. Mom plans to breast and bottle feed baby. Hand expression demonstrated w/noted colostrum from everted nipples. Encouraged to massage breast as baby BF. Mom denies issues BF her children. Mom speaks Spanish. RN speaks fluent Spanish and is translating. Mom encouraged to do skin-to-skin.Mom encouraged to feed baby 8-12 times/24 hours and with feeding cues.  Educated about newborn behavior. Mom encouraged to waken baby for feeds. Reviewed Baby & Me book's Breastfeeding Basics. Encouraged to call for assistance if needed and to verify proper latch.WH/LC brochure given w/resources, support groups and LC services. States BF going well at this time.  Patient Name: Girl Reece PackerBlanca Galindo ZOXWR'UToday's Date: 01/12/2014 Reason for consult: Initial assessment   Maternal Data Has patient been taught Hand Expression?: Yes Does the patient have breastfeeding experience prior to this delivery?: Yes  Feeding Feeding Type: Formula Length of feed: 20 min  LATCH Score/Interventions                      Lactation Tools Discussed/Used     Consult Status Consult Status: Follow-up Date: 01/13/14 Follow-up type: In-patient    Charyl DancerCARVER, Ja Pistole G 01/12/2014, 7:18 AM

## 2014-01-13 DIAGNOSIS — IMO0001 Reserved for inherently not codable concepts without codable children: Secondary | ICD-10-CM

## 2014-01-13 LAB — RH IG WORKUP (INCLUDES ABO/RH)
ABO/RH(D): A NEG
FETAL SCREEN: NEGATIVE
Gestational Age(Wks): 39

## 2014-01-13 MED ORDER — OXYCODONE-ACETAMINOPHEN 5-325 MG PO TABS: 1.0000 | ORAL_TABLET | Freq: Four times a day (QID) | ORAL | Status: DC | PRN

## 2014-01-13 MED ORDER — IBUPROFEN 600 MG PO TABS: 600.0000 mg | ORAL_TABLET | Freq: Four times a day (QID) | ORAL | Status: DC

## 2014-01-13 MED ORDER — SENNOSIDES-DOCUSATE SODIUM 8.6-50 MG PO TABS: 2.0000 | ORAL_TABLET | ORAL | Status: DC

## 2014-01-13 MED ORDER — LANOLIN HYDROUS EX OINT: 1.0000 "application " | TOPICAL_OINTMENT | CUTANEOUS | Status: DC | PRN

## 2014-01-13 NOTE — Plan of Care (Signed)
Problem: Discharge Progression Outcomes Goal: Barriers To Progression Addressed/Resolved Outcome: Completed/Met Date Met:  01/13/14 Goal: Activity appropriate for discharge plan Outcome: Completed/Met Date Met:  01/13/14 Goal: Tolerating diet Outcome: Completed/Met Date Met:  11[birth year] Goal: Complications resolved/controlled Outcome: Completed/Met Date Met:  01/13/14 Goal: Pain controlled with appropriate interventions Outcome: Completed/Met Date Met:  01/13/14 Goal: Afebrile, VS remain stable at discharge Outcome: Completed/Met Date Met:  01/13/14 Goal: MMR given as ordered Outcome: Not Applicable Date Met:  02/58/52

## 2014-01-13 NOTE — Discharge Summary (Signed)
Obstetric Discharge Summary Reason for Admission: cesarean section- scheduled repeat Prenatal Procedures: none Intrapartum Procedures: cesarean: low cervical, transverse Postpartum Procedures: none Complications-Operative and Postpartum: none HEMOGLOBIN  Date Value Ref Range Status  01/10/2014 11.7* 12.0 - 15.0 g/dL Final   HCT  Date Value Ref Range Status  01/10/2014 34.2* 36.0 - 46.0 % Final    Physical Exam:  General: alert, cooperative, appears stated age and no distress Lochia: appropriate Uterine Fundus: firm Incision: healing well DVT Evaluation: No evidence of DVT seen on physical exam.  Discharge Diagnoses: Term Pregnancy-delivered  Discharge Information: Date: 01/13/2014 Activity: pelvic rest Diet: routine Medications: Ibuprofen, Colace and Percocet Condition: stable Instructions: refer to practice specific booklet Discharge to: home   Newborn Data: Live born female  Birth Weight: 8 lb 1.6 oz (3674 g) APGAR: 9, 9  Home with mother.  Janee Mornhompson, McKenzie L 01/13/2014, 9:10 AM   I have seen and examined this patient and I agree with the above. [facility] for PP visit. She is breast/bottlefeeding and plans Depo. Cam HaiSHAW, Annaliesa Blann CNM 9:21 AM 01/13/2014

## 2014-01-13 NOTE — Plan of Care (Signed)
Problem: Consults Goal: Postpartum Patient Education (See Patient Education module for education specifics.)  Outcome: Completed/Met Date Met:  01/13/14 Goal: Skin Care Protocol Initiated - if Braden Score 18 or less If consults are not indicated, leave blank or document N/A  Outcome: Not Applicable Date Met:  38/45/36 Goal: Nutrition Consult-if indicated Outcome: Not Applicable Date Met:  46/80/32

## 2014-01-14 NOTE — Plan of Care (Signed)
Problem: Discharge Progression Outcomes Goal: Discharge plan in place and appropriate Outcome: Completed/Met Date Met:  01/14/14 Goal: Other Discharge Outcomes/Goals Outcome: Completed/Met Date Met:  01/14/14

## 2014-01-14 NOTE — Plan of Care (Signed)
Problem: Discharge Progression Outcomes Goal: Remove staples per MD order Outcome: Not Applicable Date Met:  60/47/99

## 2014-01-14 NOTE — Discharge Instructions (Signed)
Parto por cesrea  (Cesarean Delivery ) El parto por cesrea es el nacimiento de un beb a travs de un corte (incisin) en el abdomen y la matriz (tero).  INFORME A SU MDICO:  Todos los medicamentos que utiliza, incluyendo vitaminas, hierbas, gotas oftlmicas, cremas y medicamentos de venta libre.  Problemas previos que usted o los miembros de su familia hayan tenido con el uso de anestsicos.  Enfermedades de la sangre.  Cirugas previas.  Padecimientos mdicos.  Cualquier alergia que tenga.  Complicaciones del embarazo. RIESGOS Y COMPLICACIONES  Generalmente es un procedimiento seguro. Sin embargo, como en cualquier procedimiento, pueden surgir complicaciones. Las complicaciones posibles son:  Hemorragias.  Infeccin.  Cogulos sanguneos.  Lesin en los rganos circundantes.  Problemas con la anestesia.  Lesin al beb. ANTES DEL PROCEDIMIENTO   Le administrarn un medicamento anticido. Esto impedir que los contenidos cidos del estmago ingresen a los pulmones si vomita durante la ciruga.  Le podrn dar antibiticos para prevenir infecciones. PROCEDIMIENTO   Rasurarn la zona del pubis y la parte inferior del abdomen. Esto se realiza para evitar una infeccin en el sitio de la incisin.  Le colocarn un tubo (catter Foley) en la vejiga para drenar la orina desde la vejiga a una bolsa. Esto mantendr la vejiga vaca durante la ciruga.  Se le colocar una sonda intravenosa en una de las venas.  Le administrarn un medicamento para adormecer a zona inferior del cuerpo anestesia regional). Si estuviera en trabajo de parto, podrn aplicarle una anestesia epidural, que se utiliza tanto en el trabajo de parto como en la cesrea. Puede ser que le administren un medicamento que la har dormir (anestesia general), aunque esto no es tan frecuente.  Le harn una incisin en el abdomen que se extiende hacia el tero. Hay dos tipos bsicos de incisin:  La incisin  horizontal (transversa) Las incisiones horizontales se realizan en la mayor parte de las cesreas de rutina.  La incisin vertical. Se realiza desde la parte de arriba del abdomen hasta la parte de abajo y se usa con menos frecuencia. Se reserva para aquellas mujeres que tienen una complicacin grave (parto prematuro extremo) o bajo situaciones de emergencia.  Las incisiones horizontales y verticales pueden utilizarse ambas al mismo tiempo. Sin embargo, no es muy frecuente.  Luego se realiza una incisin en el tero para que nazca el beb.  El beb nacer.  Luego ambas incisiones se cierran con puntos absorbibles. DESPUS DEL PROCEDIMIENTO   Si estuvo despierta durante la ciruga, podr ver al beb enseguida. Si la duermen, ver al beb tan pronto como despierte.  Podr amamantar a su beb despus del procedimiento.  Podr levantarse y caminar el mismo da de la ciruga. Si debe permanecer en cama durante cierto tiempo, recibir ayuda para darse vuelta, toser y respirar profundamente despus de la ciruga. Esto ayuda a evitar complicaciones en los pulmones, como la neumona.  No se levante de la cama sola la primera vez luego de la ciruga. Necesitar ayuda para levantarse de la cama hasta que pueda hacerlo sola.  Podr darse una ducha el da siguiente a la ciruga. Despus que le quiten el apsito (vendaje) del sitio de la incisin, un enfermero la ayudar a ducharse, si necesita ayuda.  Tendr unas medias compresivas neumticas en la zona inferior de las piernas. Esto se realiza para prevenir la formacin de cogulos sanguneos. Cuando se levante y camine regularmente, ya no sern necesarias.  Nocruce las piernas al sentarse.  Si elimina cogulos   de sangre, gurdelos. Si elimina un cogulo cuando va al bao, por favor no tire la cadena. Llame al enfermero. Comunquele al enfermero si piensa que tiene demasiada hemorragia o que elimina muchos cogulos.  Le darn medicamentos si los  necesita. Dgale a los profesionales si siente dolor. Tambin le indicarn antibiticos para prevenir una infeccin.  Le quitarn la va intravenosa cuando beba una cantidad razonable de lquido. El catter Foley se retirar cuando se levante y camine.  Si su tipo sanguneo es Rh negativo y el beb es Rh positivo, le darn una inyeccin de inmunoglobulina anti D. Esta inyeccin evita que tenga problemas con el Rh en embarazos futuros. Deber colocarse la inyeccin an si se ha Development worker, communityhecho atar las trompas (ligadura de trompas).  Si le permiten llevar al beb a dar un paseo, colquelo en la cunita y empjela. No lleve al beb en sus brazos. Document Released: 01/26/2005 Document Revised: 11/16/2012 [facility] Patient Information 2015 [place], [place]. This information is not intended to replace advice given to you by your health care provider. Make sure you discuss any questions you have with your health care provider.  Cuidados en el postparto luego de un parto por cesrea  (Postpartum Care After Cesarean Delivery) Despus del parto (perodo de postparto), la estada normal en el hospital es de 24-72 horas. Si hubo problemas con el trabajo de parto o el parto, o si tiene otros problemas mdicos, es posible que Hydrologistdeba permanecer en el hospital por ms [place] Fallstiempo.  Mientras est en el hospital, recibir [place] and the Grenadinesayuda e instrucciones sobre cmo cuidar de usted misma y de su beb recin nacido durante el postparto.  Mientras est en el hospital:   Es normal que sienta dolor o molestias en la incisin en el abdomen. Asegrese de decirle a las enfermeras si Electronics engineersiente dolor, as como donde Medical laboratory scientific officersiente el dolor y Training and development officerqu empeora el dolor.  Si est amamantando, puede sentir contracciones dolorosas en el tero durante algunas semanas. Esto es normal. Las contracciones ayudan a que el tero vuelva a su tamao normal.  Es normal tener algo de sangrado despus del [place] Olivosparto.  Durante los primeros 1-3 das despus del parto, el flujo es de color  rojo y la cantidad puede ser similar a un perodo.  Es frecuente que el flujo se inicie y se Chief Strategy Officerdetenga.  En los primeros [place], puede eliminar algunos cogulos pequeos. Informe a las enfermeras si comienza a eliminar cogulos grandes o aumenta el flujo.  No  elimine los cogulos de sangre por el inodoro antes de que la Johnson Controlsenfermera los vea.  Durante los prximos 3 a [address] das despus del parto, el flujo debe ser ms acuoso y rosado o Child psychotherapistmarrn.  Jake Churche diez a catorce American International Groupdas despus del parto, el flujo debe ser una pequea cantidad de secrecin de color blanco amarillento.  La cantidad de flujo disminuir en las primeras semanas despus del parto. El flujo puede detenerse en 6-8 semanas. La mayora de las mujeres no tienen ms flujo a las 12 semanas despus del Biggersparto.  Usted debe cambiar sus apsitos con frecuencia.  Lvese bien las manos con agua y jabn durante al menos 20 segundos despus de cambiar el apsito, usar el bao o antes de Occupational psychologistsostener o Corporate treasureralimentar a su recin nacido.  Se le quitar la va intravenosa (IV) cuando ya est bebiendo suficientes lquidos.  El tubo de drenaje para la orina (catter urinario) que se inserta antes del parto puede ser retirado Express Scriptsluego de 6-8 horas despus del parto o cuando las  piernas vuelvan a tener sensibilidad. Usted puede sentir que tiene que vaciar la vejiga durante las primeras 6-8 horas despus de que le quiten el Licensed conveyancercatter.  Si se siente dbil, mareada o se desmaya, llame a su enfermera antes de levantarse de la cama por primera vez y antes de tomar una ducha por primera vez.  En los primeros [address] despus del parto, podr sentir las mamas sensibles y Quecheellenas. Esto se llama congestin. La sensibilidad en los senos por lo general desaparece dentro de las 48-72 horas despus de que ocurre la congestin. Tambin puede notar que la [place] se escapa de sus senos. Si no est amamantando no estimule sus pechos. La estimulacin de las mamas hace que sus senos produzcan ms  [place]inf[place]leche.  Pasar tanto tiempo como sea posible con el beb recin nacido es muy importante. Durante este [place]iempo, usted y su beb deben sentirse cerca y conocerse uno al otro. Tener al beb en su habitacin (alojamiento conjunto) ayudar a fortalecer el vnculo con el beb recin nacido. Esto le dar tiempo para conocerlo y atenderlo de Staytonmanera cmoda.  Las hormonas se modifican despus del parto. A veces, los cambios hormonales pueden causar tristeza o ganas de llorar por un tiempo. Estos sentimientos no deben durar ms de Hughes Supplyunos pocos das. Si duran ms que eso, debe hablar con su mdico.  Si lo desea, hable con su mdico acerca de los mtodos de planificacin familiar o mtodos anticonceptivos.  Hable con su mdico acerca de las vacunas. El mdico puede indicarle que se aplique las siguientes vacunas antes de salir del hospital:  [place] and PrincipeVacuna contra el ttanos, la difteria y la tos ferina (Tdap) o el ttanos y la difteria (Td). Es muy importante que usted y su familia (incluyendo a los abuelos) u otras personas que cuidan al recin nacido estn al da con las vacunas Tdap o Td. Las vacunas Tdap o Td pueden ayudar a proteger al recin nacido de enfermedades.  Inmunizacin contra la rubola.  Inmunizacin contra la varicela.  Inmunizacin contra la gripe. Usted debe recibir esta vacunacin anual si no la ha recibido Academic librariandurante el embarazo. Document Released: 01/13/2012 [facility] Patient Information 2015 [place]ExitCare, MarylandLLC. This information is not intended to replace advice given to you by your health care provider. Make sure you discuss any questions you have with your health care provider.

## 2014-01-14 NOTE — Lactation Note (Signed)
This note was copied from the chart of Shelby Camacho. Lactation Consultation Note  Mom has small cracks on tips of nipples.  Assisted with positioning and latching deep.  'Baby opens wide and latches deep.  Comfort gels and manual pump given with instructions on use, cleaning and EBM storage.    Patient Name: Shelby Camacho UEAVW'UToday's Date: 01/14/2014 Reason for consult: Follow-up assessment;Breast/nipple pain   Maternal Data    Feeding Feeding Type: Breast Fed  LATCH Score/Interventions Latch: Grasps breast easily, tongue down, lips flanged, rhythmical sucking. Intervention(s): Adjust position;Assist with latch;Breast massage;Breast compression  Audible Swallowing: A few with stimulation  Type of Nipple: Everted at rest and after stimulation  Comfort (Breast/Nipple): Filling, red/small blisters or bruises, mild/mod discomfort  Problem noted: Mild/Moderate discomfort  Hold (Positioning): No assistance needed to correctly position infant at breast.  LATCH Score: 8  Lactation Tools Discussed/Used Tools: Comfort gels Pump Review: Setup, frequency, and cleaning;Milk Storage   Consult Status Consult Status: Complete    Huston FoleyMOULDEN, Tracye Szuch S 01/14/2014, 9:50 AM

## 2014-01-14 NOTE — Discharge Summary (Signed)
Obstetric Discharge Summary Reason for Admission: cesarean section Prenatal Procedures: none Intrapartum Procedures: cesarean: low cervical, transverse Postpartum Procedures: none Complications-Operative and Postpartum: none HEMOGLOBIN  Date Value Ref Range Status  01/10/2014 11.7* 12.0 - 15.0 g/dL Final   HCT  Date Value Ref Range Status  01/10/2014 34.2* 36.0 - 46.0 % Final    Physical Exam:  General: alert, cooperative, appears stated age and no distress Lochia: appropriate Uterine Fundus: firm Incision: healing well, no significant drainage, no dehiscence, no significant erythema DVT Evaluation: No evidence of DVT seen on physical exam.  Discharge Diagnoses: Term Pregnancy-delivered  Discharge Information: Date: 01/14/2014 Activity: pelvic rest Diet: routine Medications: Ibuprofen, Colace, Iron and Percocet Condition: stable and improved Instructions: refer to practice specific booklet Discharge to: home Follow-up Information    [facility] [place] Schedule an appointment as soon as possible for a visit in 4 weeks.   Why:  For your postpartum appointment.   Contact information:   [address] [place] [id number] [phone]      Newborn Data: Live born female  Birth Weight: 8 lb 1.6 oz (3674 g) APGAR: 9, 9  Home with mother.  Maurie Boettcherhompson, McKenzie L 01/14/2014, 7:02 AM   I spoke with and examined patient and agree with resident/PA/SNM's note and plan of care.  Eating, drinking, voiding, ambulating well.  +flatus.  Lochia and pain wnl.  Denies dizziness, lightheadedness, or sob. No complaints. Br/bottle, depo for contraception.  Cheral MarkerKimberly R. Booker, CNM, [facility] CareWHNP-BC 01/14/2014 8:56 AM

## 2018-10-11 ENCOUNTER — Ambulatory Visit: Payer: Self-pay | Admitting: Internal Medicine

## 2018-10-11 ENCOUNTER — Encounter: Payer: Self-pay | Admitting: Internal Medicine

## 2018-10-11 ENCOUNTER — Other Ambulatory Visit: Payer: Self-pay

## 2018-10-11 VITALS — BP 122/80 | HR 64 | Resp 12 | Ht 61.25 in | Wt 158.0 lb

## 2018-10-11 DIAGNOSIS — G5601 Carpal tunnel syndrome, right upper limb: Secondary | ICD-10-CM

## 2018-10-11 DIAGNOSIS — Z599 Problem related to housing and economic circumstances, unspecified: Secondary | ICD-10-CM

## 2018-10-11 DIAGNOSIS — Z598 Other problems related to housing and economic circumstances: Secondary | ICD-10-CM

## 2018-10-11 MED ORDER — IBUPROFEN 200 MG PO TABS: ORAL_TABLET | ORAL | 0 refills | Status: DC

## 2018-10-11 NOTE — Progress Notes (Addendum)
Subjective:    Patient ID: Shelby Camacho, female   DOB: 23-Nov-1980, 38 y.o.   MRN: [id number]   HPI   Here to establish  Complaining of right arm pain and numbness/tingling into 3 middle fingers for the past week.   Awakened her from sleep starting 1 week ago. Feels the middle fingers in her right hand as cold. If she shook out her hand, the feeling would come back. States the discomfort is gradually improving. She only has the discomfort at night. No prior injury.  She did just start working again about 2 days before the discomfort started after having been off for weeks. 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 States she was looking up for prolonged period of time. Taking ibuprofen 200-400 mg every 4-6 [id number]  Current Meds  Medication Sig  . ibuprofen (ADVIL,MOTRIN) 600 MG tablet Take 1 tablet (600 mg total) by mouth every 6 (six) hours.   No Known Allergies   Past Medical History:  Diagnosis Date  . SVD (spontaneous vaginal delivery)    x 1   Past Surgical History:  Procedure Laterality Date  . CESAREAN SECTION     x 2  . CESAREAN SECTION N/A 01/11/2014   Procedure: REPEAT CESAREAN SECTION;  Surgeon: Adam PhenixJames G Arnold, MD;  Location: [facility];  Service: Obstetrics;  Laterality: N/A;  . CHOLECYSTECTOMY  2001   Laparoscopic   Family History  Problem Relation Age of Onset  . Other Mother        Describes decompensation folllowing hip fracture:  renal and pancreatic failure per pt.  . Diabetes Mother   . Glaucoma Father   . Diabetes Sister   . Hypertension Sister   . Other Brother        BPH  . Cancer Sister        uterine cancer  . Other Sister        Unknown issue with her hands  . Asthma Sister     Social History   Socioeconomic History  . Marital status: Single    Spouse name: Not on file  . Number of children: 4  . Years of education: Not on file  . Highest education level: Not on file  Occupational History  .  Occupation: Education officer, environmentalcleaning  Social Needs  . Financial resource strain: Not on file  . Food insecurity    Worry: Not on file    Inability: Not on file  . Transportation needs    Medical: Not on file    Non-medical: Not on file  Tobacco Use  . Smoking status: Never Smoker  . Smokeless tobacco: Never Used  Substance and Sexual Activity  . Alcohol use: No  . Drug use: No  . Sexual activity: Yes    Birth control/protection: None  Lifestyle  . Physical activity    Days per week: Not on file    Minutes per session: Not on file  . Stress: Not on file  Relationships  . Social Musicianconnections    Talks on phone: Not on file    Gets together: Not on file    Attends religious service: Not on file    Active member of club or organization: Not on file    Attends meetings of clubs or organizations: Not on file    Relationship status: Not on file  . Intimate partner violence    Fear of current or ex partner: Not on file    Emotionally abused:  Not on file    Physically abused: Not on file    Forced sexual activity: Not on file  Other Topics Concern  . Not on file  Social History Narrative   Lives at home with her children   Father of daughter supportive, but not in the home.   Her sons' father is incarcerated      Review of Systems    Objective:   BP 122/80 (BP Location: Left Arm, Patient Position: Sitting, Cuff Size: Normal)   Pulse 64   Resp 12   Ht 5' 1.25" (1.556 m)   Wt 158 lb (71.7 kg)   LMP 09/13/2018 (Approximate)   Breastfeeding No   BMI 29.61 kg/m   Physical Exam  NAD HEENT: PERRL, EOM Neck:  Supple, No adenopathy Chest:  CTA CV:  RRR with normal S1 and S2, No S3, S4 or murmur.  Radial and DP pulses normal and equal. Neuro:  A & O x 3, CN II-XII grossly intact.  DTRs of upper extrems 2+/4. Motor 5/5, sensory normal. + Tinels and phalens involving median nerve at volar wrist   Assessment & Plan   Right carpal tunnel syndrome.  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 Discussed avoiding work above her head--use a ladder to get at eye level. Cock up splint at night for right wrist. Ibuprofen 600 mg twice daily with meals for 2 weeks.  Has $600 to 800 electrical bill since beginning of pandemic--has been paying rent, but unable to afford both.   Has 3 children with virtual school from home. She is the main support.  Gets some help from father of daughter.  Father of sons in jail. She will bring in copy of her bills and can see if can get some support.

## 2019-01-12 ENCOUNTER — Encounter: Payer: Self-pay | Admitting: Internal Medicine

## 2019-01-12 ENCOUNTER — Other Ambulatory Visit: Payer: Self-pay

## 2019-01-12 ENCOUNTER — Ambulatory Visit: Payer: Self-pay | Admitting: Internal Medicine

## 2019-01-12 VITALS — BP 118/80 | HR 72 | Resp 12 | Ht 61.25 in | Wt 157.0 lb

## 2019-01-12 DIAGNOSIS — G5601 Carpal tunnel syndrome, right upper limb: Secondary | ICD-10-CM

## 2019-01-12 DIAGNOSIS — L720 Epidermal cyst: Secondary | ICD-10-CM

## 2019-01-12 NOTE — Progress Notes (Signed)
    Subjective:    Patient ID: Shelby Camacho, female   DOB: 11/01/1980, 38 y.o.   MRN: [id number]   HPI   1.  Right Carpal Tunnel Syndrome:  Doing well.  Did get a splint and is wearing nightly, sometimes during the day when not busy with handiwork.    2.  Lump on right side of head.  She thinks she has had it for 1 month.  Has not enlarged and not painful.  No history of injury to the area she recalls.    3.  HM:  Had last pap and CPE at [place] to continue there with paps.  She has not had influenza vaccine this year.   Tdap up to date.  Cannot say if fasting cholesterol ever checked.  She does believer she has had fasting glucose checked.     Current Meds  Medication Sig  . ibuprofen (ADVIL) 200 MG tablet 3 pastillas 2 veces al dia con comida para 14 dias   No Known Allergies   Review of Systems    Objective:   BP 118/80 (BP Location: Left Arm, Patient Position: Sitting, Cuff Size: Normal)   Pulse 72   Resp 12   Ht 5' 1.25" (1.556 m)   Wt 157 lb (71.2 kg)   LMP 01/03/2019   BMI 29.42 kg/m   Physical Exam   HEENT:  Smooth, moveable subcutaneous cystic lesion.  NT.  No surrounding discoloration of scalp.  Right handwrists  Negative Tinel.  Good grip bilaterally.   Assessment & Plan   1.  Right Carpal Tunnel Syndrome:  Controlled with splints.  CPM  2.  Likely epidermoid or similar cyst, right side of head:  Follow.  3.  HM:  Encouraged going to free influenza vaccine clinic  Information given.  She will continue to get GYN care at [facility].

## 2019-01-12 NOTE — Patient Instructions (Addendum)
Free flu shots next Thursday, Dec 11th at [facility] for the times on Monday (Must[employer])  Airline pilot su horario y Solicitor a la clinica para cita para laboratorio en [employer]

## 2019-03-06 DIAGNOSIS — L720 Epidermal cyst: Secondary | ICD-10-CM | POA: Insufficient documentation

## 2020-12-30 ENCOUNTER — Other Ambulatory Visit: Payer: Self-pay

## 2020-12-30 DIAGNOSIS — Z1231 Encounter for screening mammogram for malignant neoplasm of breast: Secondary | ICD-10-CM

## 2021-01-09 ENCOUNTER — Other Ambulatory Visit: Payer: Self-pay

## 2021-01-09 ENCOUNTER — Ambulatory Visit
Admission: RE | Admit: 2021-01-09 | Discharge: 2021-01-09 | Disposition: A | Discharge status: Home / Self Care | Payer: No Typology Code available for payment source | Source: Ambulatory Visit | Attending: Internal Medicine | Admitting: Internal Medicine

## 2021-01-09 DIAGNOSIS — Z1231 Encounter for screening mammogram for malignant neoplasm of breast: Secondary | ICD-10-CM

## 2021-01-13 ENCOUNTER — Other Ambulatory Visit: Payer: Self-pay | Admitting: Obstetrics and Gynecology

## 2021-01-13 DIAGNOSIS — R928 Other abnormal and inconclusive findings on diagnostic imaging of breast: Secondary | ICD-10-CM

## 2021-02-06 ENCOUNTER — Encounter (INDEPENDENT_AMBULATORY_CARE_PROVIDER_SITE_OTHER): Payer: Self-pay

## 2021-02-06 ENCOUNTER — Other Ambulatory Visit: Payer: Self-pay | Admitting: Obstetrics and Gynecology

## 2021-02-06 ENCOUNTER — Other Ambulatory Visit: Payer: Self-pay

## 2021-02-06 ENCOUNTER — Ambulatory Visit: Payer: Self-pay | Admitting: *Deleted

## 2021-02-06 ENCOUNTER — Ambulatory Visit
Admission: RE | Admit: 2021-02-06 | Discharge: 2021-02-06 | Disposition: A | Discharge status: Home / Self Care | Payer: No Typology Code available for payment source | Source: Ambulatory Visit | Attending: Obstetrics and Gynecology | Admitting: Obstetrics and Gynecology

## 2021-02-06 VITALS — BP 110/70 | Wt 169.2 lb

## 2021-02-06 DIAGNOSIS — Z1239 Encounter for other screening for malignant neoplasm of breast: Secondary | ICD-10-CM

## 2021-02-06 DIAGNOSIS — R928 Other abnormal and inconclusive findings on diagnostic imaging of breast: Secondary | ICD-10-CM

## 2021-02-06 NOTE — Patient Instructions (Signed)
Explained breast self awareness with Reece Packer. Patient did not need a Pap smear today due to last Pap smear and HPV Typing was 08/18/2019. Let her know based on her previous Pap smear result that her next Pap smear is due in three years. Referred patient to the [facility] for a left breast diagnostic mammogram per recommendation. Appointment scheduled Thursday, February 06, 2021 at 1310. Patient aware of appointment and will be there. Kortny Lirette verbalized understanding.  Junie Engram, Kathaleen Maser, RN 11:00 AM

## 2021-02-06 NOTE — Progress Notes (Signed)
Shelby Camacho is a 40 y.o. female who presents to [facility] clinic today with no complaints. Patient referred to [facility] by the [facility] due to recommending additional imaging of the left breast. Screening mammogram completed 01/09/2021.   Pap Smear: Pap smear not completed today. Last Pap smear was 08/18/2019 at the [facility] clinic and was abnormal - ASCUS with negative HPV . Per patient has no history of an abnormal Pap smear prior to her most recent Pap smear. Last Pap smear result is not available in Epic. Last Pap smear result will be scanned into Epic.   Physical exam: Breasts Breasts symmetrical. No skin abnormalities bilateral breasts. No nipple retraction bilateral breasts. No nipple discharge bilateral breasts. No lymphadenopathy. No lumps palpated bilateral breasts. No complaints of pain or tenderness on exam.  MM 3D SCREEN BREAST BILATERAL  Result Date: 01/10/2021 CLINICAL DATA:  Screening. EXAM: DIGITAL SCREENING BILATERAL MAMMOGRAM WITH TOMOSYNTHESIS AND CAD TECHNIQUE: Bilateral screening digital craniocaudal and mediolateral oblique mammograms were obtained. Bilateral screening digital breast tomosynthesis was performed. The images were evaluated with computer-aided detection. COMPARISON:  05/01/2010 ACR Breast Density Category c: The breast tissue is heterogeneously dense, which may obscure small masses. FINDINGS: In the left breast, a possible asymmetry as well as separate area of possible distortion warrants further evaluation. In the right breast, no findings suspicious for malignancy. IMPRESSION: Further evaluation is suggested for possible asymmetry as well as separate area of possible distortion in the left breast. RECOMMENDATION: Diagnostic mammogram and possibly ultrasound of the left breast. (Code:[id number]) The patient will be contacted regarding the findings, and additional imaging will be scheduled. BI-RADS CATEGORY  0: Incomplete. Need  additional imaging evaluation and/or prior mammograms for comparison. Electronically Signed   By: Elberta Fortis M.D.   On: 01/10/2021 11:05         Pelvic/Bimanual Pap is not indicated today per BCCCP guidelines.   Smoking History: Patient has never smoked.   Patient Navigation: Patient education provided. Access to services provided for patient through [facility] program. Spanish interpreter [name] from [facility] provided.    Breast and Cervical Cancer Risk Assessment: Patient does not have family history of breast cancer, known genetic mutations, or radiation treatment to the chest before age 47. Patient does not have history of cervical dysplasia, immunocompromised, or DES exposure in-utero.  Risk Assessment     Risk Scores       02/06/2021   Last edited by: Narda Rutherford, LPN   5-year risk: 0.3 %   Lifetime risk: 4.7 %            A: BCCCP exam without pap smear No complaints.  P: Referred patient to the [facility] for a left breast diagnostic mammogram per recommendation. Appointment scheduled Thursday, February 06, 2021 at 1310.  Priscille Heidelberg, RN 02/06/2021 11:00 AM

## 2021-02-13 ENCOUNTER — Ambulatory Visit
Admission: RE | Admit: 2021-02-13 | Discharge: 2021-02-13 | Disposition: A | Discharge status: Home / Self Care | Payer: No Typology Code available for payment source | Source: Ambulatory Visit | Attending: Obstetrics and Gynecology | Admitting: Obstetrics and Gynecology

## 2021-02-13 DIAGNOSIS — R928 Other abnormal and inconclusive findings on diagnostic imaging of breast: Secondary | ICD-10-CM

## 2021-03-26 ENCOUNTER — Ambulatory Visit: Payer: Self-pay | Admitting: Internal Medicine

## 2021-06-26 ENCOUNTER — Telehealth: Payer: Self-pay | Admitting: Internal Medicine

## 2021-06-26 NOTE — Telephone Encounter (Signed)
Patient has been scheduled for 06/27/2021

## 2021-06-26 NOTE — Telephone Encounter (Signed)
Patient called stating one eye has been red and itchy for 2 weeks and would like to be seen to evaluate. Pt.. denies discharge, being swollen, pain, or having eye crust .  Please advise

## 2021-06-27 ENCOUNTER — Ambulatory Visit: Payer: Self-pay | Admitting: Internal Medicine

## 2021-06-27 ENCOUNTER — Encounter: Payer: Self-pay | Admitting: Internal Medicine

## 2021-06-27 VITALS — BP 110/62 | HR 68 | Resp 16 | Ht 61.0 in | Wt 164.0 lb

## 2021-06-27 DIAGNOSIS — H00012 Hordeolum externum right lower eyelid: Secondary | ICD-10-CM

## 2021-06-27 DIAGNOSIS — H1031 Unspecified acute conjunctivitis, right eye: Secondary | ICD-10-CM

## 2021-06-27 MED ORDER — OFLOXACIN 0.3 % OP SOLN: 2.0000 [drp] | Freq: Four times a day (QID) | OPHTHALMIC | 0 refills | Status: DC

## 2021-06-27 NOTE — Patient Instructions (Signed)
2 gotas Johnson's Baby Shampoo en 1 taza (8 ounces) agua tibia--limpia dos veces al dia.  Warm pack eye 20 minutes dos veces al dia.

## 2021-06-27 NOTE — Progress Notes (Signed)
    Subjective:    Patient ID: Shelby Camacho, female   DOB: December 08, 1980, 41 y.o.   MRN: [id number]   HPI  Shelby Camacho interprets  Right red eye:  Started May 2nd--had foreign body sensation--felt a bump on lash line of lower eyelid.  No drainage, but did have a tiny pustular head.  May 3th, noted redness of eye itself.  Eye is watering.  Eye itches.  No pain.  No fever.  No visual change.  Feels like she has sand in eye.   Left eye is without symptoms.   Used tea bag.    Current Meds  Medication Sig   norgestimate-ethinyl estradiol (ORTHO-CYCLEN) 0.25-35 MG-MCG tablet Take 1 tablet by mouth daily.   No Known Allergies   Review of Systems    Objective:   BP 110/62 (BP Location: Left Arm, Patient Position: Sitting, Cuff Size: Normal)   Pulse 68   Resp 16   Ht 5\' 1"  (1.549 m)   Wt 164 lb (74.4 kg)   LMP 05/28/2021 (Within Weeks)   BMI 30.99 kg/m   Physical Exam NAD HEENT:  PERRL, EOMI, Left eye without injection, but with temporal pterygium not involving visual field.  Right eye with obvious conjunctival injection and clear watery discharge.  Small mucosal like bleb at mid lash line, lower lid with extension on mucosal surface to lower palpebral conjunctiva.  No pustular head currently.   Fluorescein applied to eye with no abrasion or injury to cornea noted.   TMs pearly gray, throat without injection Neck:  Supple, No adenopathy Chest:  CTA CV:  RRR without murmur or rub.     Assessment & Plan    ?Hordeolum with secondary bacterial conjunctivitis, Right eye.  Ocuflox 2 drops right eye 4 times daily for 7 days. To wash lash lines twice daily with warm water and minimal no tears baby shampoo after warm packing for 20 minutes.   To call if worsens or no improvement in 48 hours.

## 2022-02-02 IMAGING — MG MM BREAST LOCALIZATION CLIP
4 series · 5 of 12 positions shown · non-contrast
Comparison: Previous exams.
COMPARISON: Previous exams.

Addendum:
CLINICAL DATA: 40-year-old with a screening detected indeterminate
asymmetry involving the UPPER INNER QUADRANT of the LEFT breast
without sonographic correlate.

EXAM:
LEFT BREAST STEREOTACTIC CORE NEEDLE BIOPSY

[L CC synth-2D]
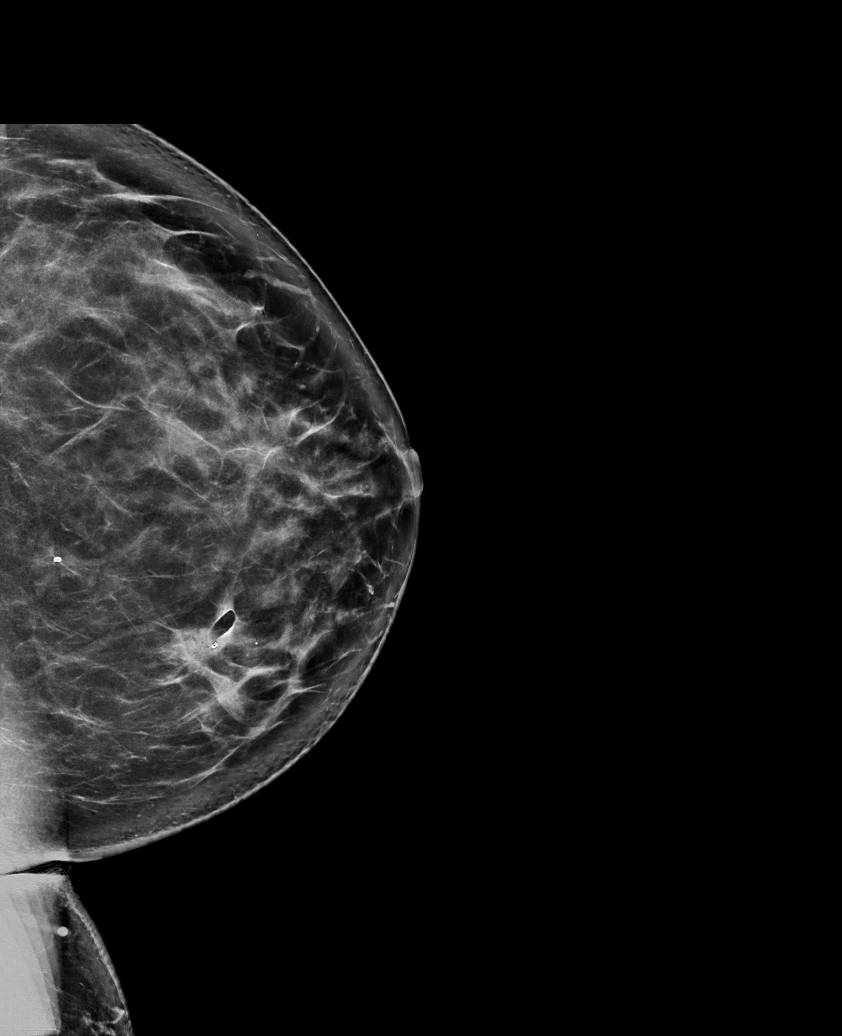

[L ML synth-2D]
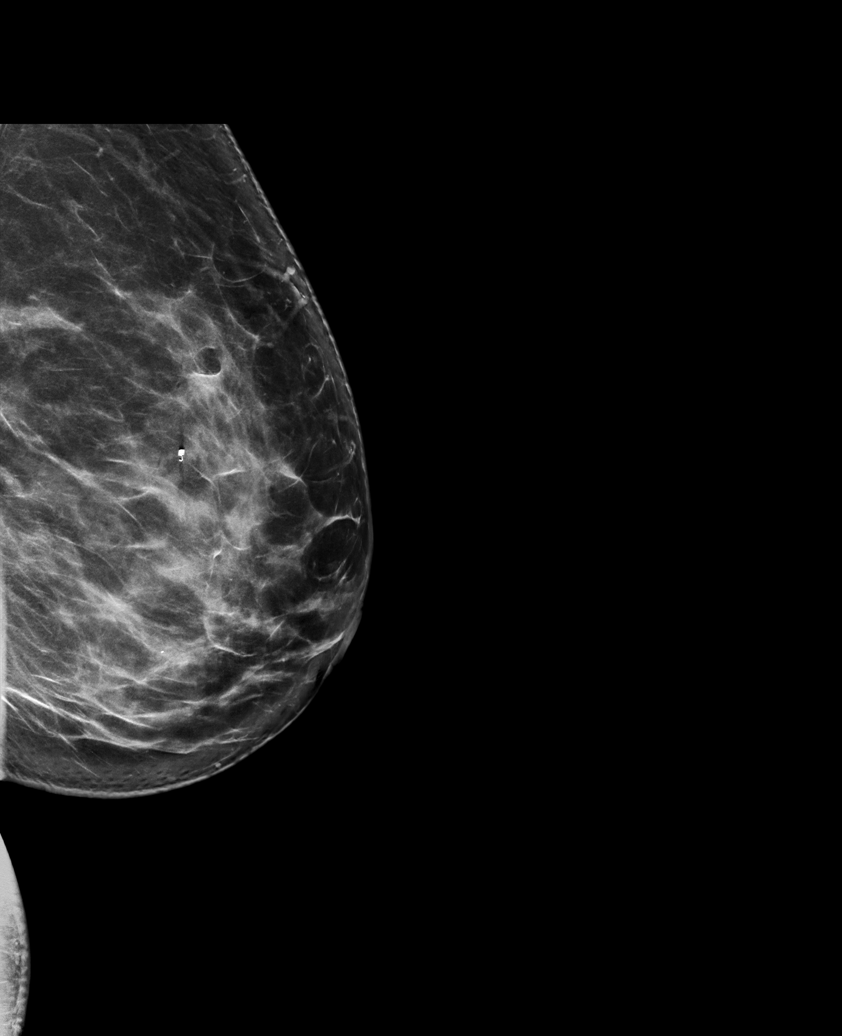

[L ML tomo · 2 of 101 frames shown]
[frame 33/101]
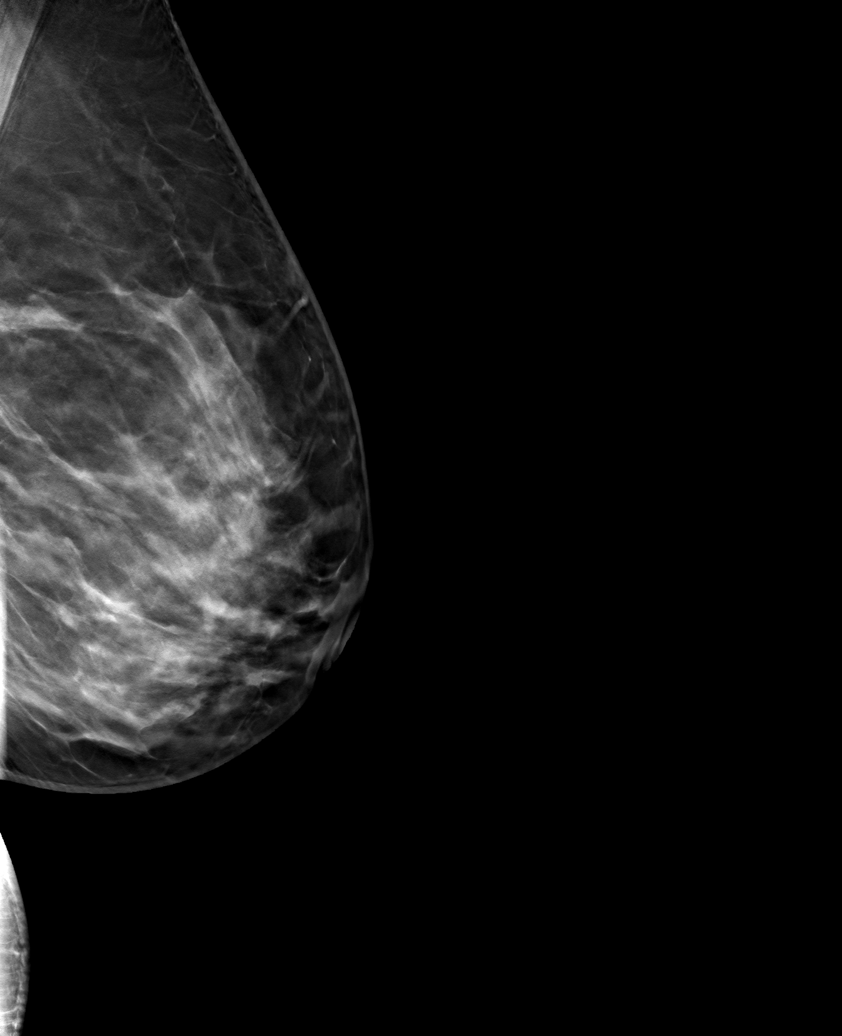
[frame 51/101]
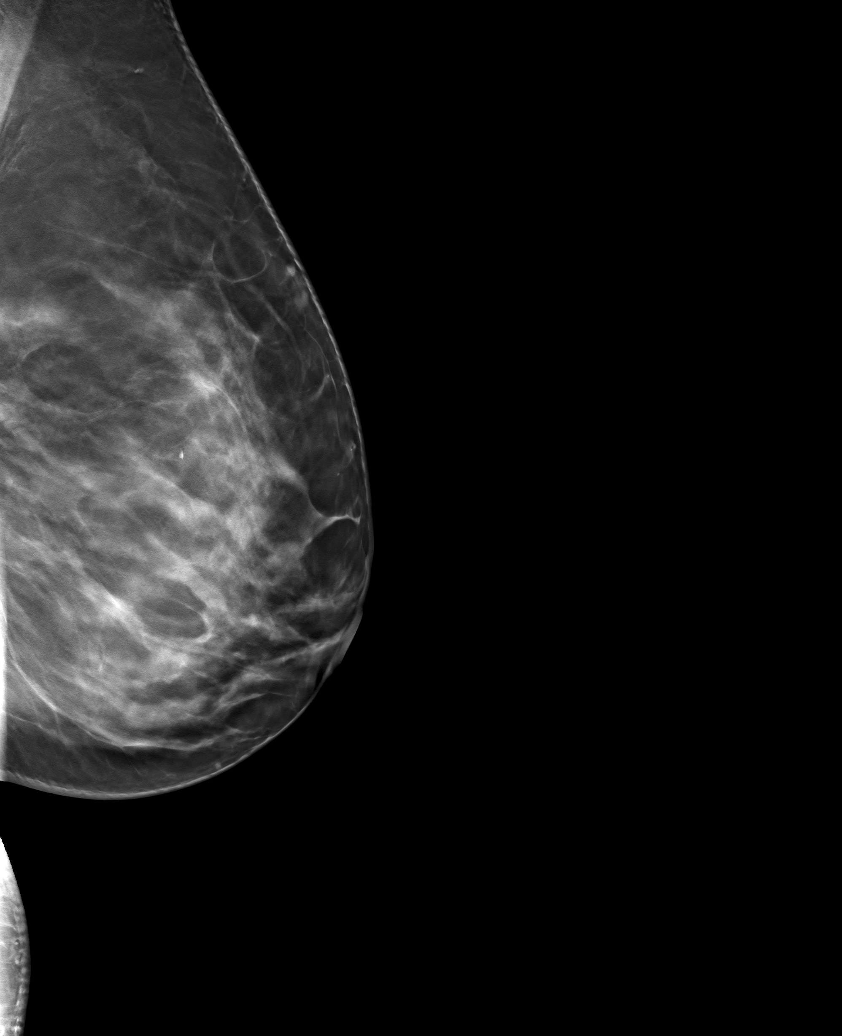

[L CC tomo · tomo slice 49/98.0]
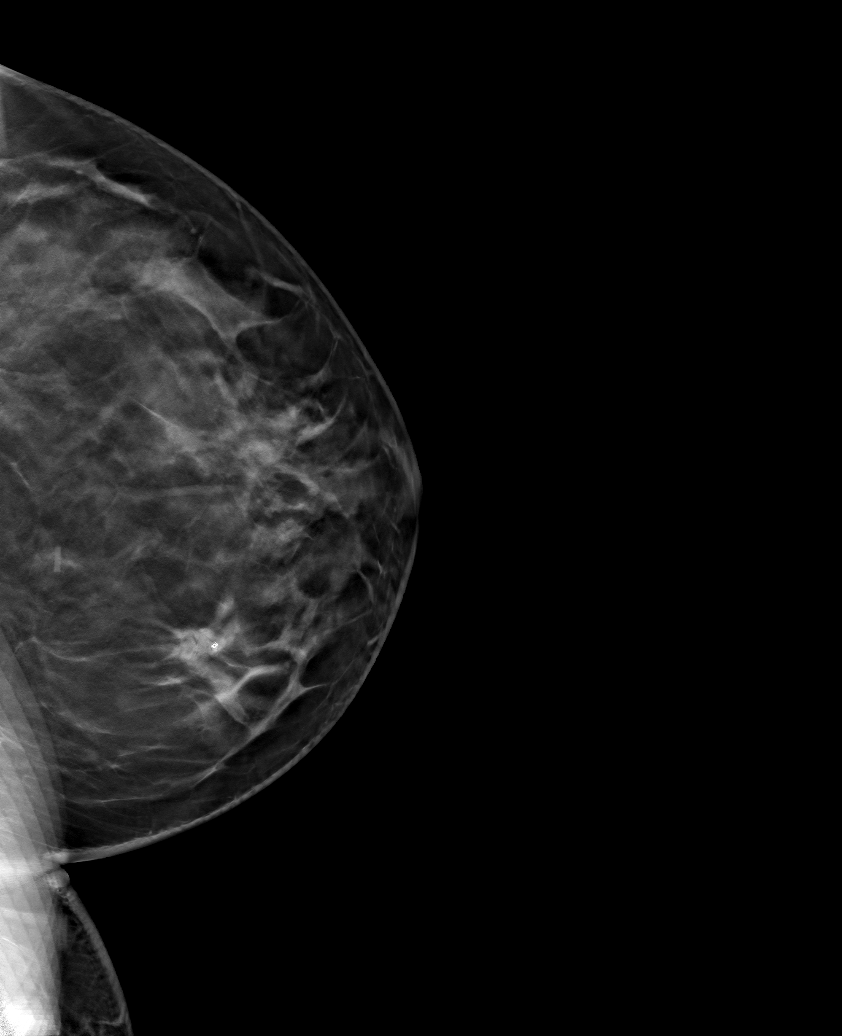

[5 of 12 positions shown; findings below may reference images not displayed]



Lesion quadrant: UPPER INNER QUADRANT.

Using sterile technique with chlorhexidine as skin antisepsis, 1%
lidocaine and 1% lidocaine with epinephrine as local anesthetic,
under stereotactic tomosynthesis guidance, a 9 gauge Brevera vacuum
assisted device was used to perform core needle biopsy of the
asymmetry in the UPPER INNER QUADRANT at middle depth using a
superior approach. Specimen radiograph was performed showing soft
tissue density in all of the core samples. At the conclusion of the
procedure, a coil shaped tissue marker clip was deployed into the
biopsy cavity.

The patient tolerated the procedure well without apparent immediate
complications.

Follow-up full field CC and mediolateral mammographic images with
tomosynthesis demonstrate the clip appropriately positioned at the
anterior margin of the biopsied asymmetry in the UPPER INNER
QUADRANT at middle depth. Expected post biopsy changes are present
without evidence of hematoma.
IMPRESSION: Stereotactic tomosynthesis guided biopsy of an asymmetry in the
UPPER INNER QUADRANT of the LEFT breast.

ADDENDUM:
Pathology revealed FIBROADENOMATOID AND COLUMNAR CELL CHANGES,
PSEUDOANGIOMATOUS STROMAL HYPERPLASIA of the LEFT breast, upper
inner quadrant, (coil clip). This was found to be concordant by Dr.
Klpigbb Moolman.

Pathology results were discussed with the patient by telephone by
patient reported doing well after the biopsy with tenderness at the
site. Post biopsy instructions and care were reviewed and questions
were answered. The patient was encouraged to call The [REDACTED]

The patient was instructed to return for annual screening
mammography in January 2022.

Pathology results reported by Zixi Bavay, RN on 02/14/2021.



Lesion quadrant: UPPER INNER QUADRANT.

Using sterile technique with chlorhexidine as skin antisepsis, 1%
lidocaine and 1% lidocaine with epinephrine as local anesthetic,
under stereotactic tomosynthesis guidance, a 9 gauge Brevera vacuum
assisted device was used to perform core needle biopsy of the
asymmetry in the UPPER INNER QUADRANT at middle depth using a
superior approach. Specimen radiograph was performed showing soft
tissue density in all of the core samples. At the conclusion of the
procedure, a coil shaped tissue marker clip was deployed into the
biopsy cavity.

The patient tolerated the procedure well without apparent immediate
complications.

Follow-up full field CC and mediolateral mammographic images with
tomosynthesis demonstrate the clip appropriately positioned at the
anterior margin of the biopsied asymmetry in the UPPER INNER
QUADRANT at middle depth. Expected post biopsy changes are present
without evidence of hematoma.
IMPRESSION: Stereotactic tomosynthesis guided biopsy of an asymmetry in the
UPPER INNER QUADRANT of the LEFT breast.

## 2022-03-05 ENCOUNTER — Telehealth: Payer: Self-pay

## 2022-03-05 NOTE — Telephone Encounter (Signed)
Patient would like an appointment for congestion that started two days ago. No other symptoms. Has started to take an allergy nasal spray today

## 2022-03-09 NOTE — Telephone Encounter (Signed)
No longer wants appt for these symptoms

## 2022-03-10 ENCOUNTER — Ambulatory Visit: Payer: Self-pay | Admitting: Internal Medicine

## 2022-04-08 ENCOUNTER — Telehealth: Payer: Self-pay

## 2022-04-08 LAB — LAB REPORT - SCANNED
Chlamydia, Swab/Urine, PCR: NEGATIVE
HM HIV Screening: NEGATIVE

## 2022-04-08 NOTE — Telephone Encounter (Signed)
Attempted to call patient using language line interpreter# [id number]. Telephone answered and call disconnected. Called second time and left a voice message with BCCCP contact information.

## 2022-04-10 DIAGNOSIS — R7303 Prediabetes: Secondary | ICD-10-CM

## 2022-04-10 HISTORY — DX: Prediabetes: R73.03

## 2022-04-10 LAB — HM PAP SMEAR: HPV, high-risk: NEGATIVE

## 2022-04-13 ENCOUNTER — Encounter: Payer: Self-pay | Admitting: Internal Medicine

## 2022-04-13 ENCOUNTER — Ambulatory Visit: Payer: Self-pay | Admitting: Internal Medicine

## 2022-04-13 VITALS — BP 110/78 | HR 66 | Resp 18 | Ht 61.0 in | Wt 168.0 lb

## 2022-04-13 DIAGNOSIS — R519 Headache, unspecified: Secondary | ICD-10-CM

## 2022-04-13 DIAGNOSIS — N912 Amenorrhea, unspecified: Secondary | ICD-10-CM

## 2022-04-13 DIAGNOSIS — H547 Unspecified visual loss: Secondary | ICD-10-CM | POA: Insufficient documentation

## 2022-04-13 NOTE — Progress Notes (Signed)
    Subjective:    Patient ID: Shelby Camacho, female   DOB: 1980-10-27, 42 y.o.   MRN: [id number]   HPI  Isidore Moos interprets   Decreased visual acuity for about 6 months:  Small print hard to see--needing to hold items far away to be able to see clearly.   Also, her father has glaucoma.  She is using low strength reading glasses, but makes her dizzy.  She does not look over the top of them for distant vision.  She has had headaches for past 2 months.    2.  Had gynecology check [facility]:   She stopped taking BCPs in June of 2023 and no period since.  No hot flashes or night sweats.  Energy has been fine past 2 months.  She stopped taking the BCPs as she was no longer sexually active and has no partner and no likelihood of becoming sexually active in near future.  She was told her exam was normal.  Has had vaginal dryness for about 1 year.  Has good energy.     No outpatient medications have been marked as taking for the 04/13/22 encounter (Office Visit) with Mack Hook, MD.   No Known Allergies   Review of Systems    Objective:   BP 110/78 (BP Location: Left Arm, Patient Position: Sitting, Cuff Size: Normal)   Pulse 66   Resp 18   Ht '5\' 1"'$  (1.549 m)   Wt 168 lb (76.2 kg)   LMP 07/10/2021 (Approximate) Comment: not sure if post menopause but that is what she is wanting to check  BMI 31.74 kg/m   Physical Exam NAD HEENT:  PERRL, EOMI, discs sharp, visual field full to confrontation. Neck:  Supple, No adenopathy Chest:  CTA CV:  RRR without murmur or rub.     Assessment & Plan   Decreased visual acuity:  Describes presbyopia, which is likely cause of headaches as well, but to calll if does not resolve after using glasses regularly.  Referral to Optometry also for pressure readings with family history of glaucoma.  2.  Amenorrhea:  perhaps just in menopause.  However with headaches and somewhat young age, will check TSH and FSH to see if more than  one pituitary hormone abnormal.  She is on the young side for menopause.  3.  HM:  to schedule fasting labs and CPE without gynecological care to address screenings/vaccines.

## 2022-04-20 ENCOUNTER — Other Ambulatory Visit: Payer: Self-pay

## 2022-04-20 DIAGNOSIS — Z13220 Encounter for screening for lipoid disorders: Secondary | ICD-10-CM

## 2022-04-20 DIAGNOSIS — N912 Amenorrhea, unspecified: Secondary | ICD-10-CM

## 2022-04-21 LAB — COMPREHENSIVE METABOLIC PANEL
ALT: 18 IU/L (ref 0–32)
AST: 17 IU/L (ref 0–40)
Albumin/Globulin Ratio: 1.8 (ref 1.2–2.2)
Albumin: 4.6 g/dL (ref 3.9–4.9)
Alkaline Phosphatase: 102 IU/L (ref 44–121)
BUN/Creatinine Ratio: 14 (ref 9–23)
BUN: 11 mg/dL (ref 6–24)
Bilirubin Total: 0.7 mg/dL (ref 0.0–1.2)
CO2: 24 mmol/L (ref 20–29)
Calcium: 9.5 mg/dL (ref 8.7–10.2)
Chloride: 103 mmol/L (ref 96–106)
Creatinine, Ser: 0.77 mg/dL (ref 0.57–1.00)
Globulin, Total: 2.5 g/dL (ref 1.5–4.5)
Glucose: 103 mg/dL — ABNORMAL HIGH (ref 70–99)
Potassium: 4.9 mmol/L (ref 3.5–5.2)
Sodium: 141 mmol/L (ref 134–144)
Total Protein: 7.1 g/dL (ref 6.0–8.5)
eGFR: 99 mL/min/{1.73_m2} (ref >59)

## 2022-04-21 LAB — CBC WITH DIFFERENTIAL/PLATELET
Basophils Absolute: 0 10*3/uL (ref 0.0–0.2)
Basos: 1 %
EOS (ABSOLUTE): 0.1 10*3/uL (ref 0.0–0.4)
Eos: 1 %
Hematocrit: 40.6 % (ref 34.0–46.6)
Hemoglobin: 13.3 g/dL (ref 11.1–15.9)
Immature Grans (Abs): 0 10*3/uL (ref 0.0–0.1)
Immature Granulocytes: 0 %
Lymphocytes Absolute: 2.3 10*3/uL (ref 0.7–3.1)
Lymphs: 35 %
MCH: 29.2 pg (ref 26.6–33.0)
MCHC: 32.8 g/dL (ref 31.5–35.7)
MCV: 89 fL (ref 79–97)
Monocytes Absolute: 0.3 10*3/uL (ref 0.1–0.9)
Monocytes: 5 %
Neutrophils Absolute: 3.7 10*3/uL (ref 1.4–7.0)
Neutrophils: 58 %
Platelets: 347 10*3/uL (ref 150–450)
RBC: 4.55 x10E6/uL (ref 3.77–5.28)
RDW: 12.8 % (ref 11.7–15.4)
WBC: 6.4 10*3/uL (ref 3.4–10.8)

## 2022-04-21 LAB — LIPID PANEL W/O CHOL/HDL RATIO
Cholesterol, Total: 189 mg/dL (ref 100–199)
HDL: 51 mg/dL (ref >39)
LDL Chol Calc (NIH): 114 mg/dL — ABNORMAL HIGH (ref 0–99)
Triglycerides: 136 mg/dL (ref 0–149)
VLDL Cholesterol Cal: 24 mg/dL (ref 5–40)

## 2022-04-21 LAB — TSH: TSH: 0.998 u[IU]/mL (ref 0.450–4.500)

## 2022-04-25 LAB — HGB A1C W/O EAG: Hgb A1c MFr Bld: 6.2 % — ABNORMAL HIGH (ref 4.8–5.6)

## 2022-04-25 LAB — SPECIMEN STATUS REPORT

## 2022-04-25 LAB — FOLLICLE STIMULATING HORMONE: FSH: 60.6 m[IU]/mL

## 2022-07-13 ENCOUNTER — Other Ambulatory Visit: Payer: Self-pay | Admitting: Internal Medicine

## 2022-07-13 ENCOUNTER — Other Ambulatory Visit: Payer: Self-pay

## 2022-07-13 DIAGNOSIS — R7303 Prediabetes: Secondary | ICD-10-CM

## 2022-07-14 LAB — HGB A1C W/O EAG: Hgb A1c MFr Bld: 6.2 % — ABNORMAL HIGH (ref 4.8–5.6)

## 2022-09-07 ENCOUNTER — Encounter: Payer: Self-pay | Admitting: Internal Medicine

## 2022-09-07 ENCOUNTER — Ambulatory Visit: Payer: Self-pay | Admitting: Internal Medicine

## 2022-09-07 VITALS — BP 120/80 | HR 60 | Ht 61.25 in | Wt 162.0 lb

## 2022-09-07 DIAGNOSIS — R7303 Prediabetes: Secondary | ICD-10-CM | POA: Insufficient documentation

## 2022-09-07 DIAGNOSIS — Z1231 Encounter for screening mammogram for malignant neoplasm of breast: Secondary | ICD-10-CM

## 2022-09-07 DIAGNOSIS — I83813 Varicose veins of bilateral lower extremities with pain: Secondary | ICD-10-CM | POA: Insufficient documentation

## 2022-09-07 DIAGNOSIS — E669 Obesity, unspecified: Secondary | ICD-10-CM

## 2022-09-07 DIAGNOSIS — B078 Other viral warts: Secondary | ICD-10-CM | POA: Insufficient documentation

## 2022-09-07 DIAGNOSIS — H11003 Unspecified pterygium of eye, bilateral: Secondary | ICD-10-CM | POA: Insufficient documentation

## 2022-09-07 DIAGNOSIS — Z0000 Encounter for general adult medical examination without abnormal findings: Secondary | ICD-10-CM

## 2022-09-07 NOTE — Progress Notes (Addendum)
Subjective:    Patient ID: Shelby Camacho, female   DOB: [birth year], 42 y.o.   MRN: [id number]   HPI  CPE with pap  1.  Pap:  08/2019:  ASCUS through what looks like [facility], but unable to read the scanned in report as to whether HPV preformed.   States she had repeat pap at [facility], she believes 01/2022 and was normal.    2.  Mammogram:  Last mammogram 02/2021 with biopsy of left breast, benign findings.  Was to return for repeat bilateral mammogram in 01/2022.  She states she did not receive notification.  No family history of breast cancer.  3.  Osteoprevention:  Drinks 1 cup milk daily.  She is willing to drink 3-4 servings daily.  Walks 45-60 minutes daily.  Occasionally, is outside with her child at the park.    4.  Guaiac Cards/FIT:  Never performed.      5.  Colonoscopy:  Never.  No family history of colon cancer.    6.  Immunizations:  Has had 2 COVID vaccines, both in 2021.  She has had the influenza vaccine in past, but does not obtain routinely.    7.  Glucose/Cholesterol:  A1C remained at 6.2% in June.  She states she did try to decrease tortillas and eat more veggies.  Increased her walking to daily.   Cholesterol was okay in March, though LDL would be high if develops DM.   Lipid Panel     Component Value Date/Time   CHOL 189 04/20/2022 1108   TRIG 136 04/20/2022 1108   HDL 51 04/20/2022 1108   LDLCALC 114 (H) 04/20/2022 1108   LABVLDL 24 04/20/2022 1108   8.  Other:  fatigue and not losing weight, but just made changes 1 month ago and has lost 6 lbs.  CBC, TSH normal in March.  She feels like she has had these complaints for about 1 month however.      No outpatient medications have been marked as taking for the 09/07/22 encounter (Office Visit) with Julieanne Manson, MD.   No Known Allergies   Review of Systems  Respiratory:  Negative for shortness of breath.   Cardiovascular:  Negative for chest pain and leg swelling.       Varicose veins and  pain with walking.   Has never used compression stockings or elevated her legs in the evening.  Gastrointestinal:  Negative for blood in stool.  Skin:        No change in skin or hair texture.   Bump on side of fingers.        Objective:   BP 120/80 (BP Location: Right Arm, Patient Position: Sitting, Cuff Size: Normal)   Pulse 60   Ht 5' 1.25" (1.556 m)   Wt 162 lb (73.5 kg)   LMP 07/10/2021 (Approximate) Comment: not sure if post menopause but that is what she is wanting to check  BMI 30.36 kg/m   Physical Exam Constitutional:      Appearance: She is obese.  HENT:     Head: Normocephalic and atraumatic.     Right Ear: Tympanic membrane, ear canal and external ear normal.     Left Ear: Tympanic membrane, ear canal and external ear normal.     Nose: Nose normal.     Mouth/Throat:     Mouth: Mucous membranes are moist.     Pharynx: Oropharynx is clear.  Eyes:     Extraocular Movements: Extraocular movements  intact.     Pupils: Pupils are equal, round, and reactive to light.     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  Cardiovascular:     Rate and Rhythm: Normal rate and regular rhythm.     Heart sounds: S1 normal and S2 normal. No murmur heard.    No friction rub. No S3 or S4 sounds.     Comments: No carotid bruits.  Carotid, radial, femoral, DP and PT pulses normal and equal.  LE with varicosities.   Pulmonary:     Effort: Pulmonary effort is normal.     Breath sounds: Normal breath sounds and air entry.  Chest:     Comments: Deferred as performed at [facility]. Abdominal:     General: Bowel sounds are normal.     Palpations: Abdomen is soft. There is no hepatomegaly, splenomegaly or mass.     Tenderness: There is no abdominal tenderness.     Hernia: No hernia is present.  Genitourinary:    Comments: Deferred as had exam at [facility]. Musculoskeletal:        General: Normal range of  motion.     Cervical back: Normal range of motion and neck supple.     Right lower leg: No edema.     Left lower leg: No edema.  Lymphadenopathy:     Head:     Right side of head: No submental or submandibular adenopathy.     Left side of head: No submental or submandibular adenopathy.     Cervical: No cervical adenopathy.     Upper Body:     Right upper body: No supraclavicular adenopathy.     Left upper body: No supraclavicular adenopathy.     Lower Body: No right inguinal adenopathy. No left inguinal adenopathy.  Skin:    General: Skin is warm.     Capillary Refill: Capillary refill takes less than 2 seconds.     Comments: Flat wart on side of left middle finger and smaller wart on ulnar side/edge of hand.    Neurological:     General: No focal deficit present.     Mental Status: She is alert and oriented to person, place, and time.     Cranial Nerves: Cranial nerves 2-12 are intact.     Sensory: Sensation is intact.     Motor: Motor function is intact.     Coordination: Coordination is intact.     Gait: Gait is intact.     Deep Tendon Reflexes: Reflexes are normal and symmetric.  Psychiatric:        Speech: Speech normal.        Behavior: Behavior normal. Behavior is cooperative.      Assessment & Plan   CPE without pap:  gyne exam performed at [facility] She is thinking about COVID vaccination  Encouraged influenza vaccine in Sept/Oct. Mammogram through [facility]  2.  Flat warts:  return for treatment with liquid nitrogen.  3.  Prediabetes/obesity:  has made good changes with diet and physical activity.  To continue.  Return in 6 months for A1C and with me subsequently  4.  Fatigue:  recent labs normal.  Possible body's adjustment to lifestyle changes.  To call if continues or worsens.    5.  Bilateral pterygium:  encouraged her to wear sunglasses when outside.   6.  Varicose veins:  encouraged sitting and elevating legs throughout the day.  Order booklet for thigh high  compression stockings given.  Showed her how to measure and order.

## 2022-11-05 ENCOUNTER — Other Ambulatory Visit: Payer: Self-pay | Admitting: Obstetrics and Gynecology

## 2022-11-05 DIAGNOSIS — Z1231 Encounter for screening mammogram for malignant neoplasm of breast: Secondary | ICD-10-CM

## 2022-11-24 ENCOUNTER — Ambulatory Visit: Payer: Self-pay

## 2022-11-24 ENCOUNTER — Inpatient Hospital Stay: Admission: RE | Admit: 2022-11-24 | Payer: Self-pay | Source: Ambulatory Visit

## 2022-12-24 ENCOUNTER — Ambulatory Visit: Payer: Self-pay | Admitting: Internal Medicine

## 2022-12-24 ENCOUNTER — Encounter: Payer: Self-pay | Admitting: Internal Medicine

## 2022-12-24 VITALS — BP 100/70 | HR 60 | Resp 16 | Ht 61.25 in | Wt 157.0 lb

## 2022-12-24 DIAGNOSIS — L918 Other hypertrophic disorders of the skin: Secondary | ICD-10-CM | POA: Insufficient documentation

## 2022-12-24 DIAGNOSIS — B078 Other viral warts: Secondary | ICD-10-CM

## 2022-12-24 NOTE — Progress Notes (Signed)
Procedure Note:  Liquid nitrogen application to 5 flat warts on hands and 1 skin tag on posterior neck: Liquid nitrogen was applied for 3-5 seconds to the skin lesions  Lesions as below:  all flat warts save for small skin tag, posterior left neck base.   Left middle finger, left thumb index finger web, mid palm, hypothenar eminence Right thumb posterior neck base, left   expected blistering or scabbing reaction explained. Do not pick at the areas. Patient reminded to expect hypopigmented scars from the procedure. Return if lesions fail to fully resolve.Skin tag  Call for signs of infection--discussed

## 2023-01-26 ENCOUNTER — Inpatient Hospital Stay
Admission: RE | Admit: 2023-01-26 | Discharge: 2023-01-26 | Disposition: A | Discharge status: Home / Self Care | Payer: Self-pay | Source: Ambulatory Visit | Attending: Obstetrics and Gynecology | Admitting: Obstetrics and Gynecology

## 2023-01-26 ENCOUNTER — Encounter: Payer: Self-pay | Admitting: Internal Medicine

## 2023-01-26 ENCOUNTER — Ambulatory Visit: Payer: Self-pay | Admitting: Hematology and Oncology

## 2023-01-26 VITALS — BP 119/72 | Wt 158.0 lb

## 2023-01-26 DIAGNOSIS — Z1231 Encounter for screening mammogram for malignant neoplasm of breast: Secondary | ICD-10-CM

## 2023-01-26 NOTE — Patient Instructions (Signed)
Taught Shelby Camacho about self breast awareness and gave educational materials to take home. Patient did not need a Pap smear today due to last Pap smear was in  per patient.  Let her know [facility] will cover Pap smears every 5 years unless has a history of abnormal Pap smears. Referred patient to the [facility] for screening mammogram. Appointment scheduled for 01/26/2023. Patient aware of appointment and will be there. Let patient know will follow up with her within the next couple weeks with results. Shelby Camacho verbalized understanding.  Pascal Lux, NP 8:38 AM

## 2023-01-26 NOTE — Progress Notes (Signed)
[name] is a 42 y.o. female who presents to [facility] clinic today with no complaints.    Pap Smear: Pap not smear completed today. Last Pap smear was 04/08/2022 and was normal. Per patient has no history of an abnormal Pap smear. Last Pap smear result is available in Epic.   Physical exam: Breasts Breasts symmetrical. No skin abnormalities bilateral breasts. No nipple retraction bilateral breasts. No nipple discharge bilateral breasts. No lymphadenopathy. No lumps palpated bilateral breasts.   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 This was found to be concordant by Dr. Quincy Carnes. Pathology results were discussed with the patient by telephone by Durene Cal, Bilingual Patient Services Representative. The patient reported doing well after the biopsy with tenderness at the site. Post biopsy instructions and care were reviewed and questions were answered. The patient was encouraged to call [facility] for any additional concerns. The patient was instructed to return for annual screening mammography in December, 2023. Pathology results reported by Rene Kocher, RN on 02/14/2021. Electronically Signed   By: Hulan Saas M.D.   On: 02/14/2021 12:24   Result Date: 02/14/2021 CLINICAL DATA:  42 year old with a screening detected indeterminate asymmetry involving the UPPER INNER QUADRANT of the LEFT breast without sonographic correlate. EXAM: LEFT BREAST STEREOTACTIC CORE NEEDLE BIOPSY COMPARISON:  Previous exams. FINDINGS: The patient and I discussed the procedure of stereotactic-guided biopsy including benefits and alternatives. We discussed the high likelihood of a successful procedure. We discussed the risks  of the procedure including infection, bleeding, tissue injury, clip migration, and inadequate sampling. Informed written consent was given. The usual time out protocol was performed immediately prior to the procedure. Lesion quadrant: UPPER INNER QUADRANT. Using sterile technique with chlorhexidine as skin antisepsis, 1% lidocaine and 1% lidocaine with epinephrine as local anesthetic, under stereotactic tomosynthesis guidance, a 9 gauge Brevera vacuum assisted device was used to perform core needle biopsy of the asymmetry in the UPPER INNER QUADRANT at middle depth using a superior approach. Specimen radiograph was performed showing soft tissue density in all of the core samples. At the conclusion of the procedure, a coil shaped tissue marker clip was deployed into the biopsy cavity. The patient tolerated the procedure well without apparent immediate complications. Follow-up full field CC and mediolateral mammographic images with tomosynthesis demonstrate the clip appropriately positioned at the anterior margin of the biopsied asymmetry in the UPPER INNER QUADRANT at middle depth. Expected post biopsy changes are present without evidence of hematoma. IMPRESSION: Stereotactic tomosynthesis guided biopsy of an asymmetry in the UPPER INNER QUADRANT of the LEFT breast. Electronically Signed: By: Hulan Saas M.D. 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 This was found to be concordant by Dr. Quincy Carnes. Pathology results were discussed with the patient by telephone by Durene Cal, Bilingual Patient Services Representative. The patient reported doing well after the biopsy with tenderness at the site. Post biopsy instructions and care were reviewed and questions were answered. The  patient was encouraged to call [facility] for any additional concerns. The patient was instructed to return for annual screening  mammography in December, 2023. Pathology results reported by Rene Kocher, RN on 02/14/2021. Electronically Signed   By: Hulan Saas M.D.   On: 02/14/2021 12:24   Result Date: 02/14/2021 CLINICAL DATA:  42 year old with a screening detected indeterminate asymmetry involving the UPPER INNER QUADRANT of the LEFT breast without sonographic correlate. EXAM: LEFT BREAST STEREOTACTIC CORE NEEDLE BIOPSY COMPARISON:  Previous exams. FINDINGS: The patient and I discussed the procedure of stereotactic-guided biopsy including benefits and alternatives. We discussed the high likelihood of a successful procedure. We discussed the risks of the procedure including infection, bleeding, tissue injury, clip migration, and inadequate sampling. Informed written consent was given. The usual time out protocol was performed immediately prior to the procedure. Lesion quadrant: UPPER INNER QUADRANT. Using sterile technique with chlorhexidine as skin antisepsis, 1% lidocaine and 1% lidocaine with epinephrine as local anesthetic, under stereotactic tomosynthesis guidance, a 9 gauge Brevera vacuum assisted device was used to perform core needle biopsy of the asymmetry in the UPPER INNER QUADRANT at middle depth using a superior approach. Specimen radiograph was performed showing soft tissue density in all of the core samples. At the conclusion of the procedure, a coil shaped tissue marker clip was deployed into the biopsy cavity. The patient tolerated the procedure well without apparent immediate complications. Follow-up full field CC and mediolateral mammographic images with tomosynthesis demonstrate the clip appropriately positioned at the anterior margin of the biopsied asymmetry in the UPPER INNER QUADRANT at middle depth. Expected post biopsy changes are present without evidence  of hematoma. IMPRESSION: Stereotactic tomosynthesis guided biopsy of an asymmetry in the UPPER INNER QUADRANT of the LEFT breast. Electronically Signed: By: Hulan Saas M.D. On: 02/13/2021 12:38   MS DIGITAL DIAG TOMO UNI LEFT Result Date: 02/06/2021 CLINICAL DATA:  Patient recalled from screening for left breast asymmetry. EXAM: DIGITAL DIAGNOSTIC UNILATERAL LEFT MAMMOGRAM WITH TOMOSYNTHESIS AND CAD; ULTRASOUND LEFT BREAST LIMITED TECHNIQUE: Left digital diagnostic mammography and breast tomosynthesis was performed. The images were evaluated with computer-aided detection.; Targeted ultrasound examination of the left breast was performed. COMPARISON:  Previous exam(s). ACR Breast Density Category c: The breast tissue is heterogeneously dense, which may obscure small masses. FINDINGS: There is a persistent irregular asymmetry within the medial left breast, further evaluated with additional imaging. On physical exam, no mass is palpated within the medial left breast. Targeted ultrasound is performed, showing normal tissue without suspicious mass within the medial left breast. No left axillary adenopathy. IMPRESSION: Persistent focal asymmetry within the medial left breast. RECOMMENDATION: Stereotactic guided core needle biopsy persistent focal asymmetry medial left breast. I have discussed the findings and recommendations with the patient. If applicable, a reminder letter will be sent to the patient regarding the next appointment. BI-RADS CATEGORY  4: Suspicious. Electronically Signed   By: Annia Belt M.D.   On: 02/06/2021 14:24  MM 3D SCREEN BREAST BILATERAL Result Date: 01/10/2021 CLINICAL DATA:  Screening. EXAM: DIGITAL SCREENING BILATERAL MAMMOGRAM WITH TOMOSYNTHESIS AND CAD TECHNIQUE: Bilateral screening digital craniocaudal and mediolateral oblique mammograms were obtained. Bilateral screening digital breast tomosynthesis was performed. The images were evaluated with computer-aided detection.  COMPARISON:  05/01/2010 ACR Breast Density Category c: The breast tissue is heterogeneously dense, which may obscure small masses. FINDINGS: In the left breast, a possible asymmetry as well as separate area of possible distortion warrants further evaluation. In the right breast, no findings suspicious for malignancy. IMPRESSION: Further evaluation is suggested for possible asymmetry as well as separate area of possible distortion in the left breast.  RECOMMENDATION: Diagnostic mammogram and possibly ultrasound of the left breast. (Code:[id number]-L-72M) The patient will be contacted regarding the findings, and additional imaging will be scheduled. BI-RADS CATEGORY  0: Incomplete. Need additional imaging evaluation and/or prior mammograms for comparison. Electronically Signed   By: Elberta Fortis M.D.   On: 01/10/2021 11:05        Pelvic/Bimanual Pap is not indicated today    Smoking History: Patient has never smoked and was not referred to quit line.    Patient Navigation: Patient education provided. Access to services provided for patient through [facility] program. Natale Lay interpreter provided. No transportation provided   Colorectal Cancer Screening: Per patient has never had colonoscopy completed No complaints today.    Breast and Cervical Cancer Risk Assessment: Patient does not have family history of breast cancer, known genetic mutations, or radiation treatment to the chest before age 56. Patient does not have history of cervical dysplasia, immunocompromised, or DES exposure in-utero.  Risk Scores as of Encounter on 01/26/2023     Dondra Spry           5-year 0.32%   Lifetime 5.25%            Last calculated by Caprice Red, CMA on 01/26/2023 at  8:39 AM        A: [facility] exam without pap smear No complaints with benign exam.   P: Referred patient to the [facility] for a screening mammogram. Appointment scheduled 01/26/2023.  Pascal Lux, NP 01/26/2023  8:46 AM

## 2023-03-08 ENCOUNTER — Other Ambulatory Visit: Payer: Self-pay

## 2023-03-08 DIAGNOSIS — R7303 Prediabetes: Secondary | ICD-10-CM

## 2023-03-09 LAB — HEMOGLOBIN A1C
Est. average glucose Bld gHb Est-mCnc: 128 mg/dL
Hgb A1c MFr Bld: 6.1 % — ABNORMAL HIGH (ref 4.8–5.6)

## 2023-03-11 ENCOUNTER — Ambulatory Visit: Payer: Self-pay | Admitting: Internal Medicine

## 2023-03-11 ENCOUNTER — Encounter: Payer: Self-pay | Admitting: Internal Medicine

## 2023-03-11 VITALS — BP 110/80 | HR 59 | Ht 61.25 in | Wt 160.0 lb

## 2023-03-11 DIAGNOSIS — B078 Other viral warts: Secondary | ICD-10-CM

## 2023-03-11 DIAGNOSIS — R7303 Prediabetes: Secondary | ICD-10-CM

## 2023-03-11 NOTE — Progress Notes (Signed)
    Subjective:    Patient ID: Shelby Camacho, female   DOB: 1980-03-08, 43 y.o.   MRN: [id number]   HPI  Shelby Camacho interprets   Prediabetes:  A1C down a bit to 6.1%.  She has cut back on carbs--rice, bread, tortillas, pan dulce.  Eating more vegetables.  Trying to eat fiber rich carbs as well.   She now has a Photographer and working out for 1 hour daily 6 days weekly--walks and uses bicycle.  Does do some weights under son's supervision.    2.  Warts on hands:  all of then but 2 on left middle finger and palm resolved.  Would like retreatment.    3.  HM:  has not had influenza nor COVID vaccines.    Current Meds  Medication Sig   Multiple Vitamin (MULTIVITAMIN) capsule Take 1 capsule by mouth daily.   No Known Allergies   Review of Systems    Objective:   BP 110/80 (BP Location: Left Arm, Patient Position: Sitting, Cuff Size: Normal)   Pulse (!) 59   Ht 5' 1.25" (1.556 m)   Wt 160 lb (72.6 kg)   LMP 07/10/2021 (Approximate) Comment: not sure if post menopause but that is what she is wanting to check  SpO2 98%   BMI 29.99 kg/m   Physical Exam Liquid nitrogen was applied for 5-10 seconds to the skin lesions on lateral aspect lf left middle finger--4 mm and palm of left hand--12mm.   Tolerated well without complication. Bandage placed over finger lesion.   Assessment & Plan   Prediabetes:  encouraged continued work on diet and physical activity to get A1C to normal range.    2.  Left hand and finger warts:  Expected blistering or scabbing reaction explained. Do not pick at the areas. Patient reminded to expect hypopigmented scars from the procedure. Return if lesions fail to fully resolve.  3.  HM:  we are now out of influenza and COVID vaccines.  Encouraged patient to obtain with one of the free [facility] mobile vaccine clinics planned--dates and locations given

## 2023-08-10 DIAGNOSIS — E7800 Pure hypercholesterolemia, unspecified: Secondary | ICD-10-CM | POA: Insufficient documentation

## 2023-09-03 ENCOUNTER — Other Ambulatory Visit: Payer: Self-pay

## 2023-09-03 DIAGNOSIS — R7303 Prediabetes: Secondary | ICD-10-CM

## 2023-09-03 DIAGNOSIS — Z0000 Encounter for general adult medical examination without abnormal findings: Secondary | ICD-10-CM

## 2023-09-04 LAB — LIPID PANEL W/O CHOL/HDL RATIO
Cholesterol, Total: 201 mg/dL — ABNORMAL HIGH (ref 100–199)
HDL: 51 mg/dL (ref >39)
LDL Chol Calc (NIH): 129 mg/dL — ABNORMAL HIGH (ref 0–99)
Triglycerides: 117 mg/dL (ref 0–149)
VLDL Cholesterol Cal: 21 mg/dL (ref 5–40)

## 2023-09-04 LAB — CBC WITH DIFFERENTIAL/PLATELET
Basophils Absolute: 0 x10E3/uL (ref 0.0–0.2)
Basos: 1 %
EOS (ABSOLUTE): 0 x10E3/uL (ref 0.0–0.4)
Eos: 1 %
Hematocrit: 41.4 % (ref 34.0–46.6)
Hemoglobin: 13.5 g/dL (ref 11.1–15.9)
Immature Grans (Abs): 0 x10E3/uL (ref 0.0–0.1)
Immature Granulocytes: 0 %
Lymphocytes Absolute: 1.8 x10E3/uL (ref 0.7–3.1)
Lymphs: 42 %
MCH: 29.5 pg (ref 26.6–33.0)
MCHC: 32.6 g/dL (ref 31.5–35.7)
MCV: 91 fL (ref 79–97)
Monocytes Absolute: 0.3 x10E3/uL (ref 0.1–0.9)
Monocytes: 6 %
Neutrophils Absolute: 2.2 x10E3/uL (ref 1.4–7.0)
Neutrophils: 49 %
Platelets: 324 x10E3/uL (ref 150–450)
RBC: 4.57 x10E6/uL (ref 3.77–5.28)
RDW: 12.7 % (ref 11.7–15.4)
WBC: 4.4 x10E3/uL (ref 3.4–10.8)

## 2023-09-04 LAB — COMPREHENSIVE METABOLIC PANEL WITH GFR
ALT: 18 IU/L (ref 0–32)
AST: 14 IU/L (ref 0–40)
Albumin: 4.6 g/dL (ref 3.9–4.9)
Alkaline Phosphatase: 89 IU/L (ref 44–121)
BUN/Creatinine Ratio: 17 (ref 9–23)
BUN: 12 mg/dL (ref 6–24)
Bilirubin Total: 1.2 mg/dL (ref 0.0–1.2)
CO2: 21 mmol/L (ref 20–29)
Calcium: 9.3 mg/dL (ref 8.7–10.2)
Chloride: 104 mmol/L (ref 96–106)
Creatinine, Ser: 0.69 mg/dL (ref 0.57–1.00)
Globulin, Total: 2.7 g/dL (ref 1.5–4.5)
Glucose: 104 mg/dL — ABNORMAL HIGH (ref 70–99)
Potassium: 4.4 mmol/L (ref 3.5–5.2)
Sodium: 140 mmol/L (ref 134–144)
Total Protein: 7.3 g/dL (ref 6.0–8.5)
eGFR: 110 mL/min/1.73 (ref >59)

## 2023-09-04 LAB — HEMOGLOBIN A1C
Est. average glucose Bld gHb Est-mCnc: 126 mg/dL
Hgb A1c MFr Bld: 6 % — ABNORMAL HIGH (ref 4.8–5.6)

## 2023-09-06 ENCOUNTER — Other Ambulatory Visit: Payer: Self-pay

## 2023-09-09 ENCOUNTER — Encounter: Payer: Self-pay | Admitting: Internal Medicine

## 2023-09-09 ENCOUNTER — Ambulatory Visit: Payer: Self-pay | Admitting: Internal Medicine

## 2023-09-09 VITALS — BP 122/76 | HR 62 | Resp 18 | Ht 61.0 in | Wt 161.0 lb

## 2023-09-09 DIAGNOSIS — Z0000 Encounter for general adult medical examination without abnormal findings: Secondary | ICD-10-CM

## 2023-09-09 DIAGNOSIS — R7303 Prediabetes: Secondary | ICD-10-CM

## 2023-09-09 DIAGNOSIS — Z23 Encounter for immunization: Secondary | ICD-10-CM

## 2023-09-09 DIAGNOSIS — Z124 Encounter for screening for malignant neoplasm of cervix: Secondary | ICD-10-CM

## 2023-09-09 DIAGNOSIS — E66811 Obesity, class 1: Secondary | ICD-10-CM

## 2023-09-09 DIAGNOSIS — E7800 Pure hypercholesterolemia, unspecified: Secondary | ICD-10-CM

## 2023-09-09 LAB — POCT WET PREP WITH KOH
KOH Prep POC: NEGATIVE
RBC Wet Prep HPF POC: NEGATIVE
Trichomonas, UA: NEGATIVE
Yeast Wet Prep HPF POC: NEGATIVE

## 2023-09-09 NOTE — Progress Notes (Signed)
 Subjective:    Patient ID: Shelby Camacho, female   DOB: [birth year], 43 y.o.   MRN: [id number]   HPI  CPE with pap  1.  Pap:  Last in 2021 at [facility] with ASCUS and negative for HPV.  Last year, she thought she had a pap in 2023 that was normal at [facility], but we have not received those records and she is not sure.  2.  Mammogram:  History of biopsy left breast with benign findings in 2023 with repeat mammogram in 01/2023 and normal.  No family history of breast cancer.    3.  Osteoprevention:  still not consuming milk as she felt she would last year for calcium and vitamin D requirements.  Walking 45-60 min daily.  She is outside daily.    4.  Guaiac Cards/FIT:  Did not return last year.    5.  Colonoscopy:  Never.  No family history of colon cancer  6.  Immunizations:  Due for Td Immunization History  Administered Date(s) Administered   Hep A / Hep B 04/04/2009, 05/30/2009, 12/13/2009   Influenza,inj,Quad PF,6+ Mos 11/27/2013   MMR 11/18/2006   Novel Infuenza-h1n1-09 01/14/2008   PFIZER(Purple Top)SARS-COV-2 Vaccination 07/26/2019, 08/16/2019   Td 04/04/2009, 08/11/2011   Tdap 11/18/2006, 10/30/2013   Varicella 02/28/2014     7.  Glucose/Cholesterol:  Prediabetes stable with A1C at 6.0%..  Cholesterol a bit higher.   Lipid Panel     Component Value Date/Time   CHOL 201 (H) 09/03/2023 1057   TRIG 117 09/03/2023 1057   HDL 51 09/03/2023 1057   LDLCALC 129 (H) 09/03/2023 1057   LABVLDL 21 09/03/2023 1057     No outpatient medications have been marked as taking for the 09/09/23 encounter (Office Visit) with Adella Norris, MD.   No Known Allergies  Past Medical History:  Diagnosis Date   Hypercholesterolemia 08/2023   Obesity (BMI 30.0-34.9)    Prediabetes 04/2022   SVD (spontaneous vaginal delivery)    x 1   Past Surgical History:  Procedure Laterality Date   CESAREAN SECTION N/A    2004, 2008   CESAREAN SECTION N/A 01/11/2014   Procedure:  REPEAT CESAREAN SECTION;  Surgeon: Lynwood KANDICE Solomons, MD;  Location: [facility];  Service: Obstetrics;  Laterality: N/A;   CHOLECYSTECTOMY  2001   Laparoscopic   Family History  Problem Relation Age of Onset   Other Mother        Describes decompensation folllowing hip fracture:  renal and pancreatic failure per pt.   Diabetes Mother    Glaucoma Father    Diabetes Sister    Hypertension Sister    Cancer Sister        uterine cancer   Other Sister        Unknown issue with her hands   Asthma Sister    Other Brother        BPH   Other Son        history of prediabetes, but made changes and now in normal range.   Social History   Socioeconomic History   Marital status: Single    Spouse name: Not on file   Number of children: 4   Years of education: Not on file   Highest education level: High school graduate  Occupational History   Occupation: cleaning  Tobacco Use   Smoking status: Never    Passive exposure: Never   Smokeless tobacco: Never  Vaping Use   Vaping status: Never  Used  Substance and Sexual Activity   Alcohol use: No   Drug use: No   Sexual activity: Not Currently    Birth control/protection: Post-menopausal  Other Topics Concern   Not on file  Social History Narrative   Lives at home with her children   Father of daughter supportive, but not in the home.   Her sons' father is incarcerated   Social Drivers of Corporate investment banker Strain: Low Risk  (09/09/2023)   Overall Financial Resource Strain (CARDIA)    Difficulty of Paying Living Expenses: Not very hard  Food Insecurity: No Food Insecurity (09/09/2023)   Hunger Vital Sign    Worried About Running Out of Food in the Last Year: Never true    Ran Out of Food in the Last Year: Never true  Transportation Needs: No Transportation Needs (09/09/2023)   PRAPARE - Administrator, Civil Service (Medical): No    Lack of Transportation (Non-Medical): No  Physical Activity: Not on file  Stress:  Not on file  Social Connections: Not on file  Intimate Partner Violence: Not At Risk (09/09/2023)   Humiliation, Afraid, Rape, and Kick questionnaire    Fear of Current or Ex-Partner: No    Emotionally Abused: No    Physically Abused: No    Sexually Abused: No      Review of Systems  HENT:         Hearing fine.  Eyes:  Positive for visual disturbance (Was seen by optometry--reading glasses needed now.).  Respiratory:  Negative for shortness of breath.   Cardiovascular:  Negative for chest pain, palpitations and leg swelling.  Gastrointestinal:  Negative for abdominal pain and blood in stool (No melena.).  Musculoskeletal:        Lateral right ankle swells and hurts sometimes after running.  Can last for 3 days.  Wears some sort of [employer] shoes.   No history of injury.    Neurological:  Negative for weakness and numbness.  Psychiatric/Behavioral:  Negative for dysphoric mood. The patient is not nervous/anxious.        Stressed about having prediabetes and her son also having similar issues.  Keeps her from sleeping.      Objective:   BP 122/76 (BP Location: Left Arm, Patient Position: Sitting, Cuff Size: Normal)   Pulse 62   Resp 18   Ht 5' 1 (1.549 m)   Wt 161 lb (73 kg)   LMP 07/10/2021 (Approximate) Comment: not sure if post menopause but that is what she is wanting to check  BMI 30.42 kg/m   Physical Exam Constitutional:      Appearance: She is obese.  HENT:     Head: Normocephalic and atraumatic.     Right Ear: Tympanic membrane, ear canal and external ear normal.     Left Ear: Tympanic membrane, ear canal and external ear normal.     Nose: Nose normal.     Mouth/Throat:     Mouth: Mucous membranes are moist.     Pharynx: Oropharynx is clear.  Eyes:     Extraocular Movements: Extraocular movements intact.     Conjunctiva/sclera: Conjunctivae normal.     Pupils: Pupils are equal, round, and reactive to light.     Comments: Discs sharp Bilateral mild nasal  pterygium  Neck:     Thyroid: No thyroid mass or thyromegaly.  Cardiovascular:     Rate and Rhythm: Normal rate and regular rhythm.     Heart sounds:  S1 normal and S2 normal. No murmur heard.    No friction rub. No S3 or S4 sounds.     Comments: No carotid bruits.  Carotid, radial, femoral, DP and PT pulses normal and equal.   Pulmonary:     Effort: Pulmonary effort is normal.     Breath sounds: Normal breath sounds and air entry.  Chest:  Breasts:    Right: No inverted nipple, mass or nipple discharge.     Left: No inverted nipple, mass or nipple discharge.  Abdominal:     General: Bowel sounds are normal.     Palpations: Abdomen is soft. There is no hepatomegaly, splenomegaly or mass.     Tenderness: There is no abdominal tenderness.     Hernia: No hernia is present.  Genitourinary:    Comments: Normal external female genitalia Cervix without lesion, but vaginal and cervical mucosa atrophic and cervical os stenosed closed No uterine or adnexal mass or tenderness  Musculoskeletal:        General: Normal range of motion.     Cervical back: Normal range of motion and neck supple.     Right lower leg: No edema.     Left lower leg: No edema.     Right ankle: No swelling or deformity. No tenderness.  Lymphadenopathy:     Head:     Right side of head: No submental or submandibular adenopathy.     Left side of head: No submental or submandibular adenopathy.     Cervical: No cervical adenopathy.     Upper Body:     Right upper body: No supraclavicular or axillary adenopathy.     Left upper body: No supraclavicular or axillary adenopathy.     Lower Body: No right inguinal adenopathy. No left inguinal adenopathy.  Skin:    General: Skin is warm.     Capillary Refill: Capillary refill takes less than 2 seconds.     Findings: No rash.  Neurological:     General: No focal deficit present.     Mental Status: She is alert and oriented to person, place, and time.     Cranial Nerves:  Cranial nerves 2-12 are intact.     Sensory: Sensation is intact.     Motor: Motor function is intact.     Coordination: Coordination is intact.     Gait: Gait is intact.     Deep Tendon Reflexes: Reflexes are normal and symmetric.  Psychiatric:        Behavior: Behavior normal. Behavior is cooperative.     Comments: Tearful when discussing concerns for prediabetes      Assessment & Plan   CPE with pap with history of ASCUS and negative HPV To call if does not hear from [facility] in late fall regarding new appt for mammogram Td today. FIT to return in 2 weeks Hep C screen in 6 months with fasting labs. Encouraged influenza and COvID vaccinations in fall.  2.  Prediabetes/mild obesity:  discussed only can do what she can with making regular goals for diet and physical activity.  Repeat A1C in 6 months  3.  Hypercholesterolemia;  FLP in 6 months.  4.  By the way:  right lateral ankle pain and swelling after running at times.  No findings today and has good shoes per patient.  ACE ankle brace when running.

## 2023-09-09 NOTE — Patient Instructions (Signed)
 Tome un vaso de agua antes de cada comida Tome un minimo de 6 a 8 vasos de agua diarios Coma tres veces al dia Coma una proteina y Neomia Dear grasa saludable con comida.  (huevos, pescado, pollo, pavo, y limite carnes rojas Coma 5 porciones diarias de legumbres.  Mezcle los colores Coma 2 porciones diarias de frutas con cascara cuando sea comestible Use platos pequeos Suelte su tenedor o cuchara despues de cada mordida hata que se mastique y se trague Come en la mesa con amigos o familiares por lo menos una vez al dia Apague la televisin y aparatos electrnicos durante la comida  Su objetivo debe ser perder una libra por semana  Estudios recientes indican que las personas quienes consumen todos de sus calorias durante 12 horas se bajan de pesocon Mas eficiencia.  Por ejemplo, si Usted come su primera comida a las 7:00 a.m., su comida final del dia se debe completar antes de las 7:00 p.m.

## 2023-09-10 ENCOUNTER — Other Ambulatory Visit: Payer: Self-pay | Admitting: Internal Medicine

## 2023-09-14 LAB — CYTOLOGY - PAP

## 2023-09-20 ENCOUNTER — Other Ambulatory Visit: Payer: Self-pay

## 2023-09-20 DIAGNOSIS — Z1211 Encounter for screening for malignant neoplasm of colon: Secondary | ICD-10-CM

## 2023-09-20 LAB — POC FIT TEST STOOL: Fecal Occult Blood: NEGATIVE

## 2023-09-21 ENCOUNTER — Ambulatory Visit: Payer: Self-pay | Admitting: Internal Medicine

## 2023-09-27 ENCOUNTER — Ambulatory Visit: Payer: Self-pay | Admitting: Internal Medicine

## 2024-03-17 ENCOUNTER — Other Ambulatory Visit: Payer: Self-pay

## 2024-03-17 DIAGNOSIS — E7800 Pure hypercholesterolemia, unspecified: Secondary | ICD-10-CM

## 2024-03-17 DIAGNOSIS — R7303 Prediabetes: Secondary | ICD-10-CM

## 2024-03-17 DIAGNOSIS — Z1159 Encounter for screening for other viral diseases: Secondary | ICD-10-CM

## 2024-09-08 ENCOUNTER — Other Ambulatory Visit: Payer: Self-pay

## 2024-09-12 ENCOUNTER — Encounter: Payer: Self-pay | Admitting: Internal Medicine
